# Patient Record
Sex: Female | Born: 1975
Health system: Southern US, Community
[De-identification: ages and names within clinical notes are randomized; demographics above are authoritative.]

## PROBLEM LIST (undated history)

## (undated) DIAGNOSIS — R51 Headache: Secondary | ICD-10-CM

## (undated) DIAGNOSIS — D649 Anemia, unspecified: Secondary | ICD-10-CM

## (undated) DIAGNOSIS — F32A Depression, unspecified: Secondary | ICD-10-CM

## (undated) DIAGNOSIS — F329 Major depressive disorder, single episode, unspecified: Secondary | ICD-10-CM

## (undated) DIAGNOSIS — G43909 Migraine, unspecified, not intractable, without status migrainosus: Secondary | ICD-10-CM

## (undated) HISTORY — DX: Major depressive disorder, single episode, unspecified: F32.9

## (undated) HISTORY — DX: Anemia, unspecified: D64.9

## (undated) HISTORY — DX: Headache: R51

## (undated) HISTORY — DX: Depression, unspecified: F32.A

---

## 2000-05-10 HISTORY — PX: VAGINAL HYSTERECTOMY: SUR661

## 2006-07-05 ENCOUNTER — Emergency Department (HOSPITAL_COMMUNITY): Admission: EM | Admit: 2006-07-05 | Discharge: 2006-07-05 | Payer: Self-pay | Admitting: Family Medicine

## 2006-07-26 ENCOUNTER — Emergency Department (HOSPITAL_COMMUNITY): Admission: EM | Admit: 2006-07-26 | Discharge: 2006-07-26 | Payer: Self-pay | Admitting: Family Medicine

## 2007-01-10 ENCOUNTER — Emergency Department (HOSPITAL_COMMUNITY): Admission: EM | Admit: 2007-01-10 | Discharge: 2007-01-10 | Payer: Self-pay | Admitting: Emergency Medicine

## 2007-05-05 ENCOUNTER — Emergency Department (HOSPITAL_COMMUNITY): Admission: EM | Admit: 2007-05-05 | Discharge: 2007-05-05 | Payer: Self-pay | Admitting: Emergency Medicine

## 2007-05-19 ENCOUNTER — Emergency Department (HOSPITAL_COMMUNITY): Admission: EM | Admit: 2007-05-19 | Discharge: 2007-05-19 | Payer: Self-pay | Admitting: Emergency Medicine

## 2008-02-18 ENCOUNTER — Emergency Department (HOSPITAL_COMMUNITY): Admission: EM | Admit: 2008-02-18 | Discharge: 2008-02-18 | Payer: Self-pay | Admitting: Family Medicine

## 2008-02-21 LAB — CONVERTED CEMR LAB

## 2008-05-16 ENCOUNTER — Emergency Department (HOSPITAL_COMMUNITY): Admission: EM | Admit: 2008-05-16 | Discharge: 2008-05-16 | Payer: Self-pay | Admitting: Emergency Medicine

## 2008-10-10 ENCOUNTER — Emergency Department (HOSPITAL_COMMUNITY): Admission: EM | Admit: 2008-10-10 | Discharge: 2008-10-10 | Payer: Self-pay | Admitting: Emergency Medicine

## 2009-02-17 ENCOUNTER — Ambulatory Visit: Payer: Self-pay | Admitting: Family Medicine

## 2009-02-17 DIAGNOSIS — Z862 Personal history of diseases of the blood and blood-forming organs and certain disorders involving the immune mechanism: Secondary | ICD-10-CM | POA: Insufficient documentation

## 2009-02-17 DIAGNOSIS — D649 Anemia, unspecified: Secondary | ICD-10-CM | POA: Insufficient documentation

## 2009-02-17 DIAGNOSIS — R519 Headache, unspecified: Secondary | ICD-10-CM | POA: Insufficient documentation

## 2009-02-17 DIAGNOSIS — F070 Personality change due to known physiological condition: Secondary | ICD-10-CM | POA: Insufficient documentation

## 2009-02-17 DIAGNOSIS — R51 Headache: Secondary | ICD-10-CM | POA: Insufficient documentation

## 2009-02-17 DIAGNOSIS — F418 Other specified anxiety disorders: Secondary | ICD-10-CM | POA: Insufficient documentation

## 2009-02-19 LAB — CONVERTED CEMR LAB
BUN: 15 mg/dL (ref 6–23)
Basophils Absolute: 0 10*3/uL (ref 0.0–0.1)
Basophils Relative: 0.2 % (ref 0.0–3.0)
CO2: 26 meq/L (ref 19–32)
Calcium: 9.9 mg/dL (ref 8.4–10.5)
Chloride: 104 meq/L (ref 96–112)
Cholesterol: 135 mg/dL (ref 0–200)
Creatinine, Ser: 0.9 mg/dL (ref 0.4–1.2)
Eosinophils Absolute: 0.1 10*3/uL (ref 0.0–0.7)
Eosinophils Relative: 0.9 % (ref 0.0–5.0)
GFR calc non Af Amer: 75.63 mL/min (ref >60)
Glucose, Bld: 77 mg/dL (ref 70–99)
HCT: 41.7 % (ref 36.0–46.0)
HDL: 44.9 mg/dL (ref >39.00)
Hemoglobin: 14.2 g/dL (ref 12.0–15.0)
LDL Cholesterol: 72 mg/dL (ref 0–99)
Lymphocytes Relative: 32.8 % (ref 12.0–46.0)
Lymphs Abs: 2.4 10*3/uL (ref 0.7–4.0)
MCHC: 34.1 g/dL (ref 30.0–36.0)
MCV: 89.5 fL (ref 78.0–100.0)
Monocytes Absolute: 0.3 10*3/uL (ref 0.1–1.0)
Monocytes Relative: 3.9 % (ref 3.0–12.0)
Neutro Abs: 4.6 10*3/uL (ref 1.4–7.7)
Neutrophils Relative %: 62.2 % (ref 43.0–77.0)
Platelets: 212 10*3/uL (ref 150.0–400.0)
Potassium: 4.6 meq/L (ref 3.5–5.1)
RBC: 4.66 M/uL (ref 3.87–5.11)
RDW: 11.8 % (ref 11.5–14.6)
Sodium: 141 meq/L (ref 135–145)
Total CHOL/HDL Ratio: 3
Triglycerides: 89 mg/dL (ref 0.0–149.0)
VLDL: 17.8 mg/dL (ref 0.0–40.0)
WBC: 7.4 10*3/uL (ref 4.5–10.5)

## 2009-03-20 ENCOUNTER — Ambulatory Visit: Payer: Self-pay | Admitting: Family Medicine

## 2009-04-20 ENCOUNTER — Emergency Department (HOSPITAL_COMMUNITY): Admission: EM | Admit: 2009-04-20 | Discharge: 2009-04-20 | Payer: Self-pay | Admitting: Family Medicine

## 2009-04-21 ENCOUNTER — Encounter: Payer: Self-pay | Admitting: Family Medicine

## 2009-04-21 ENCOUNTER — Ambulatory Visit: Payer: Self-pay | Admitting: Family Medicine

## 2009-04-21 ENCOUNTER — Other Ambulatory Visit: Admission: RE | Admit: 2009-04-21 | Discharge: 2009-04-21 | Payer: Self-pay | Admitting: Family Medicine

## 2009-04-23 ENCOUNTER — Encounter (INDEPENDENT_AMBULATORY_CARE_PROVIDER_SITE_OTHER): Payer: Self-pay | Admitting: *Deleted

## 2009-07-30 ENCOUNTER — Ambulatory Visit: Payer: Self-pay | Admitting: Family Medicine

## 2009-07-30 DIAGNOSIS — K5289 Other specified noninfective gastroenteritis and colitis: Secondary | ICD-10-CM | POA: Insufficient documentation

## 2010-05-17 ENCOUNTER — Emergency Department (HOSPITAL_COMMUNITY)
Admission: EM | Admit: 2010-05-17 | Discharge: 2010-05-17 | Payer: Self-pay | Source: Home / Self Care | Admitting: Emergency Medicine

## 2010-06-08 ENCOUNTER — Other Ambulatory Visit (HOSPITAL_COMMUNITY): Payer: Self-pay | Admitting: Sports Medicine

## 2010-06-08 DIAGNOSIS — T148XXA Other injury of unspecified body region, initial encounter: Secondary | ICD-10-CM

## 2010-06-09 NOTE — Assessment & Plan Note (Signed)
Summary: diarrhea, feeling tired/ alc 2:15   Vital Signs:  Patient profile:   35 year old female Height:      63 inches Weight:      128 pounds BMI:     22.76 Temp:     97.9 degrees F oral Pulse rate:   84 / minute Pulse rhythm:   regular BP sitting:   104 / 66  (left arm) Cuff size:   regular  Vitals Entered By: Delilah Shan CMA Duncan Dull) (July 30, 2009 2:00 PM) CC: diarrhea, vomiting   History of Present Illness: 35 yo with 2 day of vomiting diarrhea. Woke up two days ago vomiting, that has improved.  Last vomitted yesterday but still nauseated. Started getting diarrhea this morning, has had 4 or 5 loose BMs today. No blood in stool or blood in emesis. Has chills but no fevers. Able to keep down liquids, no solids. No abdominal pain.  Current Medications (verified): 1)  Ambien Cr 12.5 Mg Tbcr (Zolpidem Tartrate) .Marland Kitchen.. 1 By Mouth At Bedtime As Needed 2)  Promethazine Hcl 25 Mg  Tabs (Promethazine Hcl) .Marland Kitchen.. 1 Tab By Mouth Every 6 Hours As Needed For Nausea  Allergies: 1)  ! Penicillin  Review of Systems      See HPI General:  Complains of chills; denies fever. GI:  Complains of diarrhea, nausea, and vomiting; denies abdominal pain and bloody stools.  Physical Exam  General:  Well-developed,well-nourished,in no acute distress; alert,appropriate and cooperative throughout examination Mouth:  MMM Abdomen:  Bowel sounds positive,abdomen soft and non-tender without masses, organomegaly or hernias noted. Psych:  good eye contact, not anxious appearing.     Impression & Recommendations:  Problem # 1:  GASTROENTERITIS WITHOUT DEHYDRATION (ICD-558.9) Assessment New Likely virial.  Will send in prescription for phenergan. Advised pushing fluids. See pt instructions.  Complete Medication List: 1)  Ambien Cr 12.5 Mg Tbcr (Zolpidem tartrate) .Marland Kitchen.. 1 by mouth at bedtime as needed 2)  Promethazine Hcl 25 Mg Tabs (Promethazine hcl) .Marland Kitchen.. 1 tab by mouth every 6 hours as needed  for nausea  Patient Instructions: 1)  The main problem with gastroentereritis is dehydration. Drink plenty of fluids and take solids as you feel better. If you are unable to keep anything down and/or you show signs of dehydration( dry cracked lips, lack of tears, not urinating, very sleepy) , call our office.  Prescriptions: PROMETHAZINE HCL 25 MG  TABS (PROMETHAZINE HCL) 1 tab by mouth every 6 hours as needed for nausea  #30 x 0   Entered and Authorized by:   Ruthe Mannan MD   Signed by:   Ruthe Mannan MD on 07/30/2009   Method used:   Electronically to        [facility] (retail)       [address]       [place], [place]  [address]       Ph: [phone] or [phone]       Fax: [phone]   RxID:   [id number]   Current Allergies (reviewed today): ! PENICILLIN

## 2010-06-09 NOTE — Letter (Signed)
Summary: Out of Work  [employer] [employer] [facility]  [address] [place], [place] [address]   Phone: [phone]  Fax: [phone]    July 30, 2009   Employee:  CARLOYN LAHUE    To Whom It May Concern:   For Medical reasons, please excuse the above named employee from work for the following dates:  Start:   07/28/2009  End:   08/03/2009  If you need additional information, please feel free to contact our office.         Sincerely,    Ruthe Mannan MD

## 2010-06-13 ENCOUNTER — Encounter (HOSPITAL_COMMUNITY): Payer: Self-pay

## 2010-06-13 ENCOUNTER — Ambulatory Visit (HOSPITAL_COMMUNITY)
Admission: RE | Admit: 2010-06-13 | Discharge: 2010-06-13 | Disposition: A | Discharge status: Home / Self Care | Payer: Self-pay | Source: Ambulatory Visit | Attending: Sports Medicine | Admitting: Sports Medicine

## 2010-06-13 DIAGNOSIS — R609 Edema, unspecified: Secondary | ICD-10-CM | POA: Insufficient documentation

## 2010-06-13 DIAGNOSIS — T148XXA Other injury of unspecified body region, initial encounter: Secondary | ICD-10-CM

## 2010-06-13 DIAGNOSIS — M25569 Pain in unspecified knee: Secondary | ICD-10-CM | POA: Insufficient documentation

## 2010-08-07 ENCOUNTER — Encounter: Payer: Self-pay | Admitting: Family Medicine

## 2010-08-07 LAB — HM PAP SMEAR

## 2010-08-11 ENCOUNTER — Ambulatory Visit (INDEPENDENT_AMBULATORY_CARE_PROVIDER_SITE_OTHER): Payer: Self-pay | Admitting: Family Medicine

## 2010-08-11 ENCOUNTER — Encounter: Payer: Self-pay | Admitting: Family Medicine

## 2010-08-11 VITALS — BP 110/70 | HR 56 | Temp 98.3°F | Ht 61.5 in | Wt 136.0 lb

## 2010-08-11 DIAGNOSIS — F3289 Other specified depressive episodes: Secondary | ICD-10-CM

## 2010-08-11 DIAGNOSIS — F329 Major depressive disorder, single episode, unspecified: Secondary | ICD-10-CM

## 2010-08-11 MED ORDER — ESCITALOPRAM OXALATE 10 MG PO TABS: 10.0000 mg | ORAL_TABLET | Freq: Every day | ORAL | Status: DC

## 2010-08-11 NOTE — Progress Notes (Signed)
35 yo here for follow up depression.  Depression- diagnosed with depression 11 years ago.  Has been on multiple antidepressants in past, most recently Lexapro 10 mg daily in 04/2009.  Had been doing well with this dosage but lost her insurance and could no longer afford it.  In the past, Wellbutrin 150 mg daily had helped at one point but was ineffective when we tried it in 2010.       Last several months, she is more on edge, more tearful.  No SI or HI.  The PMH, PSH, Social History, Family History, Medications, and allergies have been reviewed in [facility], and have been updated if relevant.    Review of Systems       See HPI  Psych:  Denies anxiety,endorses depression, easily angered, and easily tearful.  Physical Exam BP 110/70  Pulse 56  Temp(Src) 98.3 F (36.8 C) (Oral)  Ht 5' 1.5" (1.562 m)  Wt 136 lb (61.689 kg)  BMI 25.28 kg/m2  General:  Well-developed,well-nourished,in no acute distress; alert,appropriate and cooperative throughout examination Head:  normocephalic and atraumatic.   Lungs:  Normal respiratory effort, chest expands symmetrically. Lungs are clear to auscultation, no crackles or wheezes. Heart:  Normal rate and regular rhythm. S1 and S2 normal without gallop, murmur, click, rub or other extra sounds. Psych:  Cognition and judgment appear intact. Alert and cooperative with normal attention span and concentration. No apparent delusions, illusions, hallucinations

## 2010-08-11 NOTE — Assessment & Plan Note (Addendum)
Deteriorated. >15 min spent with patient, at least half of which was spent on counseling Given samples of lexapro along with a written rx.

## 2010-08-17 LAB — POCT RAPID STREP A (OFFICE): Streptococcus, Group A Screen (Direct): NEGATIVE

## 2010-12-18 ENCOUNTER — Telehealth: Payer: Self-pay | Admitting: *Deleted

## 2010-12-18 NOTE — Telephone Encounter (Signed)
Pt needs form completed for patient assistance, also needs written 90 day script for lexapro to send along with it.  Form just needs your signature on 2nd page.  This is ok to wait until Monday.

## 2010-12-21 MED ORDER — ESCITALOPRAM OXALATE 10 MG PO TABS: 10.0000 mg | ORAL_TABLET | Freq: Every day | ORAL | Status: DC

## 2010-12-21 NOTE — Telephone Encounter (Signed)
Rx and form in my box.

## 2010-12-21 NOTE — Telephone Encounter (Signed)
Advised pt form and script are ready for pickup.

## 2011-06-09 ENCOUNTER — Telehealth: Payer: Self-pay | Admitting: Family Medicine

## 2011-06-09 NOTE — Telephone Encounter (Signed)
Yes ok with me

## 2011-06-09 NOTE — Telephone Encounter (Signed)
Patient is requesting an order for tb test.  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 She would like to schedule an appointment this Friday.

## 2011-06-10 NOTE — Telephone Encounter (Signed)
Spoke with patient and she stated that she cannot afford the $57.00 that we charge for the TB skin test.  She stated that she will go somewhere else to have it done.

## 2011-07-07 ENCOUNTER — Encounter: Payer: Self-pay | Admitting: Family Medicine

## 2011-07-07 ENCOUNTER — Ambulatory Visit (INDEPENDENT_AMBULATORY_CARE_PROVIDER_SITE_OTHER): Payer: Self-pay | Admitting: Family Medicine

## 2011-07-07 VITALS — BP 102/72 | HR 72 | Temp 98.1°F | Wt 143.0 lb

## 2011-07-07 DIAGNOSIS — J069 Acute upper respiratory infection, unspecified: Secondary | ICD-10-CM

## 2011-07-07 MED ORDER — AZITHROMYCIN 250 MG PO TABS: ORAL_TABLET | ORAL | Status: DC

## 2011-07-07 NOTE — Progress Notes (Signed)
SUBJECTIVE:  Erika Brock is a 36 y.o. female who complains of coryza, congestion, sneezing, sore throat, dry cough and bilateral sinus pain for 9 days. She denies a history of anorexia, chest pain, chills and fatigue and admits to a history of asthma. Patient denies smoke cigarettes.   Patient Active Problem List  Diagnoses  . ANEMIA-NOS  . POOR CONCENTRATION  . DEPRESSION  . GASTROENTERITIS WITHOUT DEHYDRATION  . HEADACHE  . ANEMIA, HX OF   Past Medical History  Diagnosis Date  . Anemia     NOS  . Depression   . Headache    Past Surgical History  Procedure Date  . Vaginal hysterectomy 2002    Prolapse, unsure if she still has ovaries   History  Substance Use Topics  . Smoking status: Never Smoker   . Smokeless tobacco: Not on file  . Alcohol Use: No   Family History  Problem Relation Age of Onset  . Diabetes Mother   . Bipolar disorder Mother   . Diabetes Father   . Diabetes Daughter   . ADD / ADHD Son    Allergies  Allergen Reactions  . Prozac (Fluoxetine Hcl) Swelling  . Penicillins     REACTION: Severe reaction.   Current Outpatient Prescriptions on File Prior to Visit  Medication Sig Dispense Refill  . escitalopram (LEXAPRO) 10 MG tablet Take 1 tablet (10 mg total) by mouth daily.  90 tablet  3  . Multiple Vitamin (MULTIVITAMIN) capsule Take 1 capsule by mouth daily.        . promethazine (PHENERGAN) 25 MG tablet Take 25 mg by mouth every 6 (six) hours as needed.        . zolpidem (AMBIEN CR) 12.5 MG CR tablet Take 12.5 mg by mouth at bedtime as needed.         The PMH, PSH, Social History, Family History, Medications, and allergies have been reviewed in [facility], and have been updated if relevant.  OBJECTIVE: BP 102/72  Pulse 72  Temp(Src) 98.1 F (36.7 C) (Oral)  Wt 143 lb (64.864 kg)  She appears well, vital signs are as noted. Ears normal.  Throat and pharynx normal.  Neck supple. No adenopathy in the neck. Nose is congested. Sinuses non tender.  The chest is clear, without wheezes or rales.  ASSESSMENT:  sinusitis  PLAN: Given duration and progression of symptoms, will treat for bacterial sinusitis with Zpack (PCN allergic). Symptomatic therapy suggested: push fluids, rest and return office visit prn if symptoms persist or worsen. Lack of antibiotic effectiveness discussed with her. Call or return to clinic prn if these symptoms worsen or fail to improve as anticipated.

## 2011-07-07 NOTE — Patient Instructions (Signed)
Take antibiotic as directed.  Drink lots of fluids.  Treat sympotmatically with Mucinex, nasal saline irrigation, and Tylenol/Ibuprofen. Also try claritin D or zyrtec D over the counter- two times a day as needed ( have to sign for them at pharmacy). You can use warm compresses.  Cough suppressant at night. Call if not improving as expected in 5-7 days.    

## 2011-10-14 ENCOUNTER — Encounter: Payer: Self-pay | Admitting: Family Medicine

## 2011-10-14 ENCOUNTER — Ambulatory Visit (INDEPENDENT_AMBULATORY_CARE_PROVIDER_SITE_OTHER): Payer: Self-pay | Admitting: Family Medicine

## 2011-10-14 VITALS — BP 110/70 | HR 72 | Temp 97.9°F | Wt 143.0 lb

## 2011-10-14 DIAGNOSIS — J069 Acute upper respiratory infection, unspecified: Secondary | ICD-10-CM

## 2011-10-14 MED ORDER — AZITHROMYCIN 250 MG PO TABS: ORAL_TABLET | ORAL | Status: AC

## 2011-10-14 MED ORDER — HYDROCOD POLST-CHLORPHEN POLST 10-8 MG/5ML PO LQCR: 5.0000 mL | Freq: Every evening | ORAL | Status: DC | PRN

## 2011-10-14 NOTE — Patient Instructions (Signed)
Take Zpack as directed.  Drink lots of fluids.  Treat sympotmatically with Mucinex, nasal saline irrigation, and Tylenol/Ibuprofen. Also try claritin D or zyrtec D over the counter- two times a day as needed ( have to sign for them at pharmacy). You can use warm compresses.  Cough suppressant at night. Call if not improving as expected in 5-7 days.    

## 2011-10-14 NOTE — Progress Notes (Signed)
SUBJECTIVE:  Erika Brock is a 36 y.o. female who complains of coryza, congestion, sore throat, post nasal drip, productive cough and bilateral sinus pain for 12 days. She denies a history of anorexia and chest pain and admits to a history of asthma. Patient denies smoke cigarettes.   Patient Active Problem List  Diagnoses  . ANEMIA-NOS  . POOR CONCENTRATION  . DEPRESSION  . GASTROENTERITIS WITHOUT DEHYDRATION  . HEADACHE  . ANEMIA, HX OF   Past Medical History  Diagnosis Date  . Anemia     NOS  . Depression   . Headache    Past Surgical History  Procedure Date  . Vaginal hysterectomy 2002    Prolapse, unsure if she still has ovaries   History  Substance Use Topics  . Smoking status: Never Smoker   . Smokeless tobacco: Not on file  . Alcohol Use: No   Family History  Problem Relation Age of Onset  . Diabetes Mother   . Bipolar disorder Mother   . Diabetes Father   . Diabetes Daughter   . ADD / ADHD Son    Allergies  Allergen Reactions  . Prozac (Fluoxetine Hcl) Swelling  . Penicillins     REACTION: Severe reaction.   Current Outpatient Prescriptions on File Prior to Visit  Medication Sig Dispense Refill  . Multiple Vitamin (MULTIVITAMIN) capsule Take 1 capsule by mouth daily.        . promethazine (PHENERGAN) 25 MG tablet Take 25 mg by mouth every 6 (six) hours as needed.        . zolpidem (AMBIEN CR) 12.5 MG CR tablet Take 12.5 mg by mouth at bedtime as needed.         The PMH, PSH, Social History, Family History, Medications, and allergies have been reviewed in [facility], and have been updated if relevant.  OBJECTIVE: BP 110/70  Pulse 72  Temp(Src) 97.9 F (36.6 C) (Oral)  Wt 143 lb (64.864 kg)  She appears well, vital signs are as noted. Ears normal.  Throat and pharynx normal.  Neck supple. No adenopathy in the neck. Nose is congested. Sinuses  tender. The chest is clear, without wheezes or rales.  ASSESSMENT:  sinusitis  PLAN: Given duration and  progression of symptoms, will treat for bacterial sinusitis with Zpack (PCN allergic). Symptomatic therapy suggested: push fluids, rest and return office visit prn if symptoms persist or worsen. Call or return to clinic prn if these symptoms worsen or fail to improve as anticipated.

## 2012-02-15 ENCOUNTER — Encounter: Payer: Self-pay | Admitting: Family Medicine

## 2012-02-15 ENCOUNTER — Ambulatory Visit (INDEPENDENT_AMBULATORY_CARE_PROVIDER_SITE_OTHER): Payer: Self-pay | Admitting: Family Medicine

## 2012-02-15 VITALS — BP 100/80 | HR 76 | Temp 98.0°F | Wt 125.0 lb

## 2012-02-15 DIAGNOSIS — K529 Noninfective gastroenteritis and colitis, unspecified: Secondary | ICD-10-CM

## 2012-02-15 DIAGNOSIS — K5289 Other specified noninfective gastroenteritis and colitis: Secondary | ICD-10-CM

## 2012-02-15 MED ORDER — KETOROLAC TROMETHAMINE 60 MG/2ML IM SOLN
60.0000 mg | Freq: Once | INTRAMUSCULAR | Status: AC
  Administered 2012-02-15: 60 mg via INTRAMUSCULAR

## 2012-02-15 NOTE — Patient Instructions (Addendum)
Please take phenergan as directed. Please call me if you are out of phenergan. Drink fluids- try to eat foods gentle on the stomach like bananas, toast.

## 2012-02-15 NOTE — Progress Notes (Signed)
(  S) Erika Brock is a 36 y.o. female with complaint of gastrointestinal symptoms of watery diarrhea, nausea, vomiting for 3 days. No blood in stool. She has not eaten anything since she vomited yesterday and this morning she felt shaky and dizzy.  Also woke up with a runny nose and slight nasal congestion this morning.  She does have chills, no fever.  Son has similar symptoms.  No sore throat.  She has phenergan at home- has not tried it yet. She is getting a headache this morning and worried that she will get a migraine.   Patient Active Problem List  Diagnosis  . ANEMIA-NOS  . POOR CONCENTRATION  . DEPRESSION  . GASTROENTERITIS WITHOUT DEHYDRATION  . HEADACHE  . ANEMIA, HX OF   Past Medical History  Diagnosis Date  . Anemia     NOS  . Depression   . Headache    Past Surgical History  Procedure Date  . Vaginal hysterectomy 2002    Prolapse, unsure if she still has ovaries   History  Substance Use Topics  . Smoking status: Current Every Day Smoker  . Smokeless tobacco: Not on file  . Alcohol Use: No   Family History  Problem Relation Age of Onset  . Diabetes Mother   . Bipolar disorder Mother   . Diabetes Father   . Diabetes Daughter   . ADD / ADHD Son    Allergies  Allergen Reactions  . Prozac (Fluoxetine Hcl) Swelling  . Penicillins     REACTION: Severe reaction.   Current Outpatient Prescriptions on File Prior to Visit  Medication Sig Dispense Refill  . calcium gluconate 500 MG tablet Take 500 mg by mouth daily.      . Multiple Vitamin (MULTIVITAMIN) capsule Take 1 capsule by mouth daily.         The PMH, PSH, Social History, Family History, Medications, and allergies have been reviewed in [facility], and have been updated if relevant.  (O)  BP 100/80  Pulse 76  Temp 98 F (36.7 C)  Wt 125 lb (56.7 kg)  Physical exam reveals the patient appears well. Hydration status: well hydrated. Abdomen: abdomen is soft without significant tenderness, masses,  organomegaly or guarding..  (A) Viral Gastroenteritis  (P) I have recommended small amounts clear fluids frequently, soups, juices, water and advance diet as tolerated. Return office visit if symptoms persist or worsen; I have alerted the patient to call if high fever, dehydration, marked weakness, fainting, increased abdominal pain, blood in stool or vomit. Also given IM toradol in office for headache.

## 2012-02-17 ENCOUNTER — Telehealth: Payer: Self-pay | Admitting: *Deleted

## 2012-02-17 MED ORDER — AZITHROMYCIN 250 MG PO TABS: ORAL_TABLET | ORAL | Status: DC

## 2012-02-17 MED ORDER — ALBUTEROL SULFATE HFA 108 (90 BASE) MCG/ACT IN AERS: 2.0000 | INHALATION_SPRAY | Freq: Four times a day (QID) | RESPIRATORY_TRACT | Status: DC | PRN

## 2012-02-17 NOTE — Telephone Encounter (Signed)
Rx sent 

## 2012-02-17 NOTE — Telephone Encounter (Signed)
Call from patient.  She says she is worse since her visit on 10/8.  States her lungs hurt when she breathes, she is coughing up yellow mucous, feels hot.  Requests [facility] to [facility].   Please advise.

## 2012-02-21 ENCOUNTER — Telehealth: Payer: Self-pay | Admitting: *Deleted

## 2012-02-21 ENCOUNTER — Encounter: Payer: Self-pay | Admitting: *Deleted

## 2012-02-21 NOTE — Telephone Encounter (Signed)
Message copied by Eliezer Bottom on Mon Feb 21, 2012 11:37 AM ------      Message from: Dedra Skeens      Created: Mon Feb 21, 2012 10:58 AM       Patient came in the office stating that Dr. Dayton Martes wrote her a "return to work" note. I could not find the note anywhere upfront. In her chart there has been a return to work letter but the letter has been deleted.             Can this patient have a return to work note? Best # [phone] to reach patient to let her know when note is ready.

## 2012-02-21 NOTE — Telephone Encounter (Signed)
Note given to patient.

## 2012-03-25 ENCOUNTER — Emergency Department (HOSPITAL_COMMUNITY)
Admission: EM | Admit: 2012-03-25 | Discharge: 2012-03-25 | Disposition: A | Discharge status: Home / Self Care | Payer: Self-pay | Attending: Emergency Medicine | Admitting: Emergency Medicine

## 2012-03-25 ENCOUNTER — Emergency Department (HOSPITAL_COMMUNITY): Payer: Self-pay

## 2012-03-25 ENCOUNTER — Encounter (HOSPITAL_COMMUNITY): Payer: Self-pay | Admitting: Physical Medicine and Rehabilitation

## 2012-03-25 DIAGNOSIS — F329 Major depressive disorder, single episode, unspecified: Secondary | ICD-10-CM | POA: Insufficient documentation

## 2012-03-25 DIAGNOSIS — R51 Headache: Secondary | ICD-10-CM | POA: Insufficient documentation

## 2012-03-25 DIAGNOSIS — R1013 Epigastric pain: Secondary | ICD-10-CM

## 2012-03-25 DIAGNOSIS — Z79899 Other long term (current) drug therapy: Secondary | ICD-10-CM | POA: Insufficient documentation

## 2012-03-25 DIAGNOSIS — K219 Gastro-esophageal reflux disease without esophagitis: Secondary | ICD-10-CM | POA: Insufficient documentation

## 2012-03-25 DIAGNOSIS — F3289 Other specified depressive episodes: Secondary | ICD-10-CM | POA: Insufficient documentation

## 2012-03-25 DIAGNOSIS — R110 Nausea: Secondary | ICD-10-CM | POA: Insufficient documentation

## 2012-03-25 DIAGNOSIS — F17200 Nicotine dependence, unspecified, uncomplicated: Secondary | ICD-10-CM | POA: Insufficient documentation

## 2012-03-25 DIAGNOSIS — D649 Anemia, unspecified: Secondary | ICD-10-CM | POA: Insufficient documentation

## 2012-03-25 LAB — URINALYSIS, ROUTINE W REFLEX MICROSCOPIC
Bilirubin Urine: NEGATIVE
Glucose, UA: NEGATIVE mg/dL
Hgb urine dipstick: NEGATIVE
Ketones, ur: NEGATIVE mg/dL
Leukocytes, UA: NEGATIVE
Nitrite: NEGATIVE
Protein, ur: NEGATIVE mg/dL
Specific Gravity, Urine: 1.008 (ref 1.005–1.030)
Urobilinogen, UA: 0.2 mg/dL (ref 0.0–1.0)
pH: 6.5 (ref 5.0–8.0)

## 2012-03-25 LAB — CBC WITH DIFFERENTIAL/PLATELET
Basophils Absolute: 0 10*3/uL (ref 0.0–0.1)
Basophils Relative: 1 % (ref 0–1)
Eosinophils Absolute: 0.1 10*3/uL (ref 0.0–0.7)
Eosinophils Relative: 1 % (ref 0–5)
HCT: 41.5 % (ref 36.0–46.0)
Hemoglobin: 14.8 g/dL (ref 12.0–15.0)
Lymphocytes Relative: 30 % (ref 12–46)
Lymphs Abs: 2.7 10*3/uL (ref 0.7–4.0)
MCH: 30.5 pg (ref 26.0–34.0)
MCHC: 35.7 g/dL (ref 30.0–36.0)
MCV: 85.4 fL (ref 78.0–100.0)
Monocytes Absolute: 0.5 10*3/uL (ref 0.1–1.0)
Monocytes Relative: 5 % (ref 3–12)
Neutro Abs: 5.6 10*3/uL (ref 1.7–7.7)
Neutrophils Relative %: 63 % (ref 43–77)
Platelets: 247 10*3/uL (ref 150–400)
RBC: 4.86 MIL/uL (ref 3.87–5.11)
RDW: 12.7 % (ref 11.5–15.5)
WBC: 8.9 10*3/uL (ref 4.0–10.5)

## 2012-03-25 LAB — COMPREHENSIVE METABOLIC PANEL
ALT: 14 U/L (ref 0–35)
AST: 18 U/L (ref 0–37)
Albumin: 4.3 g/dL (ref 3.5–5.2)
Alkaline Phosphatase: 66 U/L (ref 39–117)
BUN: 13 mg/dL (ref 6–23)
CO2: 24 mEq/L (ref 19–32)
Calcium: 9.8 mg/dL (ref 8.4–10.5)
Chloride: 105 mEq/L (ref 96–112)
Creatinine, Ser: 0.8 mg/dL (ref 0.50–1.10)
GFR calc Af Amer: >90 mL/min (ref >90)
GFR calc non Af Amer: >90 mL/min (ref >90)
Glucose, Bld: 89 mg/dL (ref 70–99)
Potassium: 3.6 mEq/L (ref 3.5–5.1)
Sodium: 140 mEq/L (ref 135–145)
Total Bilirubin: 0.2 mg/dL — ABNORMAL LOW (ref 0.3–1.2)
Total Protein: 7.7 g/dL (ref 6.0–8.3)

## 2012-03-25 LAB — POCT PREGNANCY, URINE: Preg Test, Ur: NEGATIVE

## 2012-03-25 LAB — LIPASE, BLOOD: Lipase: 40 U/L (ref 11–59)

## 2012-03-25 MED ORDER — FAMOTIDINE 20 MG PO TABS
20.0000 mg | ORAL_TABLET | Freq: Once | ORAL | Status: AC
Start: 2012-03-25 — End: 2012-03-25
  Administered 2012-03-25: 20 mg via ORAL
  Filled 2012-03-25: qty 1

## 2012-03-25 MED ORDER — GI COCKTAIL ~~LOC~~
30.0000 mL | Freq: Once | ORAL | Status: AC
  Administered 2012-03-25: 30 mL via ORAL
  Filled 2012-03-25: qty 30

## 2012-03-25 MED ORDER — OMEPRAZOLE 20 MG PO CPDR: 20.0000 mg | DELAYED_RELEASE_CAPSULE | Freq: Every day | ORAL | Status: DC

## 2012-03-25 MED ORDER — SUCRALFATE 1 G PO TABS: 1.0000 g | ORAL_TABLET | Freq: Four times a day (QID) | ORAL | Status: DC

## 2012-03-25 MED ORDER — PANTOPRAZOLE SODIUM 40 MG PO TBEC
40.0000 mg | DELAYED_RELEASE_TABLET | Freq: Once | ORAL | Status: AC
  Administered 2012-03-25: 40 mg via ORAL
  Filled 2012-03-25: qty 1

## 2012-03-25 NOTE — ED Notes (Signed)
Patient transported to X-ray 

## 2012-03-25 NOTE — ED Notes (Signed)
Back from xray

## 2012-03-25 NOTE — ED Provider Notes (Signed)
History     CSN: [id number]  Arrival date & time 03/25/12  1322   First MD Initiated Contact with Patient 03/25/12 1716      Chief Complaint  Patient presents with  . Abdominal Pain    (Consider location/radiation/quality/duration/timing/severity/associated sxs/prior treatment) Patient is a 36 y.o. female presenting with abdominal pain. The history is provided by the patient.  Abdominal Pain The primary symptoms of the illness include abdominal pain and nausea. The primary symptoms of the illness do not include fever, shortness of breath, vomiting, diarrhea, dysuria, vaginal discharge or vaginal bleeding.  Symptoms associated with the illness do not include chills.  Pt reports symptoms have been going on and off for "a while" but worsened in the last 3 days. Reports pain comes and goes. Not associated with eating, but is worse with laying down.  States taking pepto bismal with no relief. Denies vomiting. No urinary symptoms. No vaginal discharge or bleeding. No diarrhea. Normal bowel movements daily. Denies black stools. No hx of the same. Pt denies drinking alcohol. Admits to smoking and lots of caffeine beverages.   Past Medical History  Diagnosis Date  . Anemia     NOS  . Depression   . Headache     Past Surgical History  Procedure Date  . Vaginal hysterectomy 2002    Prolapse, unsure if she still has ovaries    Family History  Problem Relation Age of Onset  . Diabetes Mother   . Bipolar disorder Mother   . Diabetes Father   . Diabetes Daughter   . ADD / ADHD Son     History  Substance Use Topics  . Smoking status: Current Every Day Smoker    Types: Cigarettes  . Smokeless tobacco: Not on file  . Alcohol Use: No    OB History    Grav Para Term Preterm Abortions TAB SAB Ect Mult Living                  Review of Systems  Constitutional: Negative for fever and chills.  Respiratory: Negative for chest tightness and shortness of breath.   Gastrointestinal:  Positive for nausea and abdominal pain. Negative for vomiting and diarrhea.  Genitourinary: Negative for dysuria, vaginal bleeding and vaginal discharge.  All other systems reviewed and are negative.    Allergies  Prozac and Penicillins  Home Medications   Current Outpatient Rx  Name  Route  Sig  Dispense  Refill  . ALBUTEROL SULFATE HFA 108 (90 BASE) MCG/ACT IN AERS   Inhalation   Inhale 2 puffs into the lungs every 6 (six) hours as needed. FOR SHORTNESS OF BREATH         . CALCIUM GLUCONATE 500 MG PO TABS   Oral   Take 500 mg by mouth daily.         Marland Kitchen COENZYME Q10 30 MG PO CAPS   Oral   Take 30 mg by mouth daily.         Marland Kitchen VITAMIN B-12 PO   Oral   Take 1 tablet by mouth daily.         . MULTIVITAMINS PO CAPS   Oral   Take 1 capsule by mouth daily.          Marland Kitchen FISH OIL 500 MG PO CAPS   Oral   Take 500 mg by mouth daily.            BP 112/81  Pulse 79  Temp 98.7 F (37.1  C) (Oral)  Resp 18  SpO2 98%  Physical Exam  Nursing note and vitals reviewed. Constitutional: She appears well-developed and well-nourished. No distress.  Neck: Neck supple.  Cardiovascular: Normal rate, regular rhythm and normal heart sounds.   Pulmonary/Chest: Effort normal and breath sounds normal. No respiratory distress. She has no wheezes. She has no rales.  Abdominal: Soft. Bowel sounds are normal. She exhibits no distension. There is tenderness. There is no rebound and no guarding.       Epigastric tenderness. No CVA tenderness  Musculoskeletal: She exhibits no edema.  Neurological: She is alert.  Skin: Skin is warm and dry.  Psychiatric: She has a normal mood and affect.    ED Course  Procedures (including critical care time)  Pt with epigastric pain. No guarding. No acute abdomen. No black tarry stools. Will get labs. UA. Acute abd series.   Results for orders placed during the hospital encounter of 03/25/12  CBC WITH DIFFERENTIAL      Component Value Range    WBC 8.9  4.0 - 10.5 K/uL   RBC 4.86  3.87 - 5.11 MIL/uL   Hemoglobin 14.8  12.0 - 15.0 g/dL   HCT 40.9  81.1 - 91.4 %   MCV 85.4  78.0 - 100.0 fL   MCH 30.5  26.0 - 34.0 pg   MCHC 35.7  30.0 - 36.0 g/dL   RDW 78.2  95.6 - 21.3 %   Platelets 247  150 - 400 K/uL   Neutrophils Relative 63  43 - 77 %   Neutro Abs 5.6  1.7 - 7.7 K/uL   Lymphocytes Relative 30  12 - 46 %   Lymphs Abs 2.7  0.7 - 4.0 K/uL   Monocytes Relative 5  3 - 12 %   Monocytes Absolute 0.5  0.1 - 1.0 K/uL   Eosinophils Relative 1  0 - 5 %   Eosinophils Absolute 0.1  0.0 - 0.7 K/uL   Basophils Relative 1  0 - 1 %   Basophils Absolute 0.0  0.0 - 0.1 K/uL  COMPREHENSIVE METABOLIC PANEL      Component Value Range   Sodium 140  135 - 145 mEq/L   Potassium 3.6  3.5 - 5.1 mEq/L   Chloride 105  96 - 112 mEq/L   CO2 24  19 - 32 mEq/L   Glucose, Bld 89  70 - 99 mg/dL   BUN 13  6 - 23 mg/dL   Creatinine, Ser 0.86  0.50 - 1.10 mg/dL   Calcium 9.8  8.4 - 57.8 mg/dL   Total Protein 7.7  6.0 - 8.3 g/dL   Albumin 4.3  3.5 - 5.2 g/dL   AST 18  0 - 37 U/L   ALT 14  0 - 35 U/L   Alkaline Phosphatase 66  39 - 117 U/L   Total Bilirubin 0.2 (*) 0.3 - 1.2 mg/dL   GFR calc non Af Amer >90  >90 mL/min   GFR calc Af Amer >90  >90 mL/min  LIPASE, BLOOD      Component Value Range   Lipase 40  11 - 59 U/L  URINALYSIS, ROUTINE W REFLEX MICROSCOPIC      Component Value Range   Color, Urine YELLOW  YELLOW   APPearance CLEAR  CLEAR   Specific Gravity, Urine 1.008  1.005 - 1.030   pH 6.5  5.0 - 8.0   Glucose, UA NEGATIVE  NEGATIVE mg/dL   Hgb urine dipstick NEGATIVE  NEGATIVE   Bilirubin Urine NEGATIVE  NEGATIVE   Ketones, ur NEGATIVE  NEGATIVE mg/dL   Protein, ur NEGATIVE  NEGATIVE mg/dL   Urobilinogen, UA 0.2  0.0 - 1.0 mg/dL   Nitrite NEGATIVE  NEGATIVE   Leukocytes, UA NEGATIVE  NEGATIVE  POCT PREGNANCY, URINE      Component Value Range   Preg Test, Ur NEGATIVE  NEGATIVE   Dg Abd Acute W/chest  03/25/2012  *RADIOLOGY  REPORT*  Clinical Data: Abdominal pain.  ACUTE ABDOMEN SERIES (ABDOMEN 2 VIEW & CHEST 1 VIEW)  Comparison: Chest x-ray 04/20/2009.  Findings: Lung volumes are normal.  No consolidative airspace disease.  No pleural effusions.  No pneumothorax.  No pulmonary nodule or mass noted.  Pulmonary vasculature and the cardiomediastinal silhouette are within normal limits.  Gas and stool is seen scattered throughout the colon extending to the level of the distal rectum.  There are multiple prominent gas- filled but nondilated loops of small bowel noted throughout the central abdomen.  No definite pathologically dilated loops of small bowel are identified.  No gross evidence of pneumoperitoneum.  IMPRESSION: 1.  Nonspecific, nonobstructive bowel gas pattern, as above. 2.  No pneumoperitoneum. 3.  No radiographic evidence of acute cardiopulmonary disease.   Original Report Authenticated By: Trudie Reed, M.D.      1. Epigastric pain   2. GERD (gastroesophageal reflux disease)       MDM  Pt with epigastric pain. No acute abdomen on exam. Labs and x-ray, ua all normal. Pain completely relieved with gi cocktail, pepcid, protonix PO in ED. Pt afebrile, VS normal, no n/v. Suspect gastritis/gerd vs peptic ulcer disease. Will start on carafate. PPI. Follow up.         Lottie Mussel, PA 03/25/12 1918

## 2012-03-25 NOTE — ED Provider Notes (Signed)
Medical screening examination/treatment/procedure(s) were performed by non-physician practitioner and as supervising physician I was immediately available for consultation/collaboration.   Diar Berkel, MD 03/25/12 2251 

## 2012-03-25 NOTE — ED Notes (Signed)
Pt presents to department for evaluation of epigastric pain radiating to back. Ongoing x3 days. 3/10 pain, increases with sitting. Also states nausea. Denies fever. Denies urinary symptoms. Pt is conscious alert and oriented x4. No signs of acute distress noted.

## 2012-03-27 ENCOUNTER — Telehealth: Payer: Self-pay | Admitting: Family Medicine

## 2012-03-27 NOTE — Telephone Encounter (Signed)
[facility] Triage Call Report Triage Record Num: [id number] Operator: Griselda Miner Patient Name: Erika Brock Call Date & Time: 03/25/2012 12:27:50PM Patient Phone: [phone] PCP: Ruthe Mannan Patient Gender: Female PCP Fax : [phone] Patient DOB: 29-Apr-1976 Practice Name: [facility] Reason for Call: Caller: Adaysha/Patient; PCP: Ruthe Mannan (Family Practice); CB#: [phone]; Call regarding Abdominal pain below sternum but radiating up the ribs; Onset about 3 days 03/19/12 and describes pain finger below sternum starts as pain, the burns, then radiates following the ribcage, going into sides and back. When it happens it brings tears to her eye and she has to stop what she does. Has to walk bent over. Does not have a pattern but she it happens it can last about 30 minutes and slowly goes away. Last BM this morning and normal; Is able to swallow food/water she feels like it hits a brick in stomach. Has some nausea but no vomiting. There is some hardness over stomach area under sternum; LMP-Hysterectomy 10 years ago. Triaged using Abdominal Pain with a disposition to go to ED due to unbearable abdominal pain. Care advice given with patient demonstrating understanding. Husband will take to ED. Protocol(s) Used: Abdominal Pain Recommended Outcome per Protocol: See ED Immediately Reason for Outcome: Unbearable abdominal/pelvic pain Care Advice: ~ Allow the patient to be in a position of comfort. ~ Another adult should drive. ~ Pain medication or laxatives should not be taken until symptoms are evaluated. ~ Do not eat or drink anything until evaluated by provider. Call EMS 911 if signs and symptoms of shock develop (such as unable to stand due to faintness, dizziness, or lightheadedness; new onset of confusion; slow to respond or difficult to awaken; skin is pale, gray, cool, or moist to touch; severe weakness; loss of consciousness). ~ Write down provider's name.  List or place the following in a bag for transport with the patient: current prescription and/or nonprescription medications; alternative treatments, therapies and medications; and street drugs. ~ 03/25/2012 12:47:10PM Page 1 of 1 [id number]

## 2012-04-20 ENCOUNTER — Encounter: Payer: Self-pay | Admitting: Family Medicine

## 2012-04-20 ENCOUNTER — Ambulatory Visit (INDEPENDENT_AMBULATORY_CARE_PROVIDER_SITE_OTHER): Payer: Self-pay | Admitting: Family Medicine

## 2012-04-20 VITALS — BP 110/60 | HR 72 | Temp 97.9°F | Wt 123.0 lb

## 2012-04-20 DIAGNOSIS — J069 Acute upper respiratory infection, unspecified: Secondary | ICD-10-CM

## 2012-04-20 MED ORDER — AZITHROMYCIN 250 MG PO TABS: ORAL_TABLET | ORAL | Status: DC

## 2012-04-20 MED ORDER — ALBUTEROL SULFATE HFA 108 (90 BASE) MCG/ACT IN AERS: 2.0000 | INHALATION_SPRAY | Freq: Four times a day (QID) | RESPIRATORY_TRACT | Status: DC | PRN

## 2012-04-20 NOTE — Patient Instructions (Addendum)
Take Zpack as directed.  Drink lots of fluids.  Proair inhaler as needed for shortness of breath. Treat sympotmatically with Mucinex, nasal saline irrigation, and Tylenol/Ibuprofen.You can use warm compresses.  Cough suppressant at night. Call if not improving as expected in 5-7 days.

## 2012-04-20 NOTE — Telephone Encounter (Signed)
Pt did go to ER. 

## 2012-04-20 NOTE — Progress Notes (Signed)
SUBJECTIVE:  Erika Brock is a 36 y.o. female who complains of coryza, congestion, sneezing, sore throat and bilateral sinus pain for 8 days. She denies a history of anorexia and chest pain and denies a history of asthma. Patient does smoke cigarettes.   Patient Active Problem List  Diagnosis  . ANEMIA-NOS  . POOR CONCENTRATION  . DEPRESSION  . GASTROENTERITIS WITHOUT DEHYDRATION  . HEADACHE  . ANEMIA, HX OF  . Gastroenteritis   Past Medical History  Diagnosis Date  . Anemia     NOS  . Depression   . Headache    Past Surgical History  Procedure Date  . Vaginal hysterectomy 2002    Prolapse, unsure if she still has ovaries   History  Substance Use Topics  . Smoking status: Current Every Day Smoker    Types: Cigarettes  . Smokeless tobacco: Not on file  . Alcohol Use: No   Family History  Problem Relation Age of Onset  . Diabetes Mother   . Bipolar disorder Mother   . Diabetes Father   . Diabetes Daughter   . ADD / ADHD Son    Allergies  Allergen Reactions  . Prozac (Fluoxetine Hcl) Swelling  . Penicillins     REACTION: Severe reaction.   Current Outpatient Prescriptions on File Prior to Visit  Medication Sig Dispense Refill  . albuterol (PROVENTIL HFA;VENTOLIN HFA) 108 (90 BASE) MCG/ACT inhaler Inhale 2 puffs into the lungs every 6 (six) hours as needed. FOR SHORTNESS OF BREATH      . calcium gluconate 500 MG tablet Take 500 mg by mouth daily.      Marland Kitchen co-enzyme Q-10 30 MG capsule Take 30 mg by mouth daily.      . Cyanocobalamin (VITAMIN B-12 PO) Take 1 tablet by mouth daily.      . Multiple Vitamin (MULTIVITAMIN) capsule Take 1 capsule by mouth daily.       . Omega-3 Fatty Acids (FISH OIL) 500 MG CAPS Take 500 mg by mouth daily.       Marland Kitchen omeprazole (PRILOSEC) 20 MG capsule Take 1 capsule (20 mg total) by mouth daily.  30 capsule  1  . sucralfate (CARAFATE) 1 G tablet Take 1 tablet (1 g total) by mouth 4 (four) times daily.  60 tablet  0   The PMH, PSH,  Social History, Family History, Medications, and allergies have been reviewed in [facility], and have been updated if relevant.  OBJECTIVE: BP 110/60  Pulse 72  Temp 97.9 F (36.6 C)  Wt 123 lb (55.792 kg)  She appears well, vital signs are as noted. Ears normal.  Throat and pharynx normal.  Neck supple. No adenopathy in the neck. Nose is congested. Sinuses tender. Diffuse faint exp wheezes.  ASSESSMENT:  sinusitis and bronchitis  PLAN: Given duration and progression of symptoms, will treat for bacterial sinusitis/bronchitis. Symptomatic therapy suggested: push fluids, rest and return office visit prn if symptoms persist or worsen. Call or return to clinic prn if these symptoms worsen or fail to improve as anticipated.

## 2012-04-24 ENCOUNTER — Telehealth: Payer: Self-pay

## 2012-04-24 NOTE — Telephone Encounter (Signed)
May be viral and Zpack is still in her system.  Diarrhea could also be caused by the Zpack.  I will see her tomorrow but I would give it a few more days.

## 2012-04-24 NOTE — Telephone Encounter (Signed)
Pt was seen 04/20/12;still coughing up brown and blood tinged phlegm,S/T no better, ? Worse; head congestion no better and now diarrhea and dizziness. Pt will finish Zpak today. Pt already scheduled appt Dr Dayton Martes 04/25/12 [facility].Please advise. Pt did not want to see another dr.

## 2012-04-24 NOTE — Telephone Encounter (Signed)
Advised patient.  She will need a doctors note to be out of work for a few more days, so she will come in tomorrow.

## 2012-04-25 ENCOUNTER — Ambulatory Visit (INDEPENDENT_AMBULATORY_CARE_PROVIDER_SITE_OTHER): Payer: Self-pay | Admitting: Family Medicine

## 2012-04-25 ENCOUNTER — Telehealth: Payer: Self-pay | Admitting: *Deleted

## 2012-04-25 ENCOUNTER — Encounter: Payer: Self-pay | Admitting: Family Medicine

## 2012-04-25 VITALS — BP 110/70 | HR 84 | Temp 98.0°F | Wt 121.2 lb

## 2012-04-25 DIAGNOSIS — J069 Acute upper respiratory infection, unspecified: Secondary | ICD-10-CM

## 2012-04-25 DIAGNOSIS — R110 Nausea: Secondary | ICD-10-CM

## 2012-04-25 MED ORDER — DOXYCYCLINE HYCLATE 100 MG PO TABS: 100.0000 mg | ORAL_TABLET | Freq: Two times a day (BID) | ORAL | Status: DC

## 2012-04-25 MED ORDER — ONDANSETRON HCL 4 MG PO TABS: 4.0000 mg | ORAL_TABLET | Freq: Three times a day (TID) | ORAL | Status: DC | PRN

## 2012-04-25 MED ORDER — ONDANSETRON HCL 4 MG PO TABS
4.0000 mg | ORAL_TABLET | Freq: Once | ORAL | Status: AC
  Administered 2012-04-25: 4 mg via ORAL

## 2012-04-25 NOTE — Telephone Encounter (Signed)
Patient wants to know if she can have some zofran b/c it helped when he had it in the office

## 2012-04-25 NOTE — Telephone Encounter (Signed)
Yes, rx sent to [facility].  

## 2012-04-25 NOTE — Progress Notes (Signed)
SUBJECTIVE:  Erika Brock is a 36 y.o. female who complains of worsening coryza, congestion, sneezing, sore throat and bilateral sinus pain, now with post tussive emesis.  Finished last dose of zpack yesterday- PCN allergic. She denies a history of anorexia and chest pain and denies a history of asthma. Patient does smoke cigarettes.   Patient Active Problem List  Diagnosis  . ANEMIA-NOS  . POOR CONCENTRATION  . DEPRESSION  . GASTROENTERITIS WITHOUT DEHYDRATION  . HEADACHE  . ANEMIA, HX OF  . Gastroenteritis   Past Medical History  Diagnosis Date  . Anemia     NOS  . Depression   . Headache    Past Surgical History  Procedure Date  . Vaginal hysterectomy 2002    Prolapse, unsure if she still has ovaries   History  Substance Use Topics  . Smoking status: Current Every Day Smoker -- 0.5 packs/day for 16 years    Types: Cigarettes  . Smokeless tobacco: Never Used  . Alcohol Use: No   Family History  Problem Relation Age of Onset  . Diabetes Mother   . Bipolar disorder Mother   . Diabetes Father   . Diabetes Daughter   . ADD / ADHD Son    Allergies  Allergen Reactions  . Prozac (Fluoxetine Hcl) Swelling  . Penicillins     REACTION: Severe reaction.   Current Outpatient Prescriptions on File Prior to Visit  Medication Sig Dispense Refill  . albuterol (PROVENTIL HFA;VENTOLIN HFA) 108 (90 BASE) MCG/ACT inhaler Inhale 2 puffs into the lungs every 6 (six) hours as needed. FOR SHORTNESS OF BREATH  1 Inhaler  0  . calcium gluconate 500 MG tablet Take 500 mg by mouth daily.      Marland Kitchen co-enzyme Q-10 30 MG capsule Take 30 mg by mouth daily.      . Multiple Vitamin (MULTIVITAMIN) capsule Take 1 capsule by mouth daily.       . Omega-3 Fatty Acids (FISH OIL) 500 MG CAPS Take 500 mg by mouth daily.       Marland Kitchen omeprazole (PRILOSEC) 20 MG capsule Take 1 capsule (20 mg total) by mouth daily.  30 capsule  1  . sucralfate (CARAFATE) 1 G tablet Take 1 tablet (1 g total) by mouth 4 (four)  times daily.  60 tablet  0   The PMH, PSH, Social History, Family History, Medications, and allergies have been reviewed in [facility], and have been updated if relevant.  OBJECTIVE: BP 110/70  Pulse 84  Temp 98 F (36.7 C) (Oral)  Wt 121 lb 4 oz (54.999 kg)  SpO2 98%  She appears well, vital signs are as noted. Coughs and then vomits during exam .Ears normal.  Throat and pharynx normal.  Neck supple. No adenopathy in the neck. Nose is congested. Sinuses tender. Diffuse faint exp wheezes.  ASSESSMENT:  sinusitis and bronchitis  PLAN: ?macrolide resistance- start doxyxcycline 100 mg twice daily x 10 days, as needed zofran. Symptomatic therapy suggested: push fluids, rest and return office visit prn if symptoms persist or worsen. Call or return to clinic prn if these symptoms worsen or fail to improve as anticipated.

## 2012-05-12 ENCOUNTER — Encounter (HOSPITAL_COMMUNITY): Payer: Self-pay | Admitting: Emergency Medicine

## 2012-05-12 ENCOUNTER — Emergency Department (HOSPITAL_COMMUNITY)
Admission: EM | Admit: 2012-05-12 | Discharge: 2012-05-13 | Disposition: A | Discharge status: Home / Self Care | Payer: Self-pay | Attending: Emergency Medicine | Admitting: Emergency Medicine

## 2012-05-12 DIAGNOSIS — R112 Nausea with vomiting, unspecified: Secondary | ICD-10-CM | POA: Insufficient documentation

## 2012-05-12 DIAGNOSIS — R197 Diarrhea, unspecified: Secondary | ICD-10-CM | POA: Insufficient documentation

## 2012-05-12 DIAGNOSIS — Z79899 Other long term (current) drug therapy: Secondary | ICD-10-CM | POA: Insufficient documentation

## 2012-05-12 DIAGNOSIS — M549 Dorsalgia, unspecified: Secondary | ICD-10-CM | POA: Insufficient documentation

## 2012-05-12 DIAGNOSIS — F17200 Nicotine dependence, unspecified, uncomplicated: Secondary | ICD-10-CM | POA: Insufficient documentation

## 2012-05-12 DIAGNOSIS — Z862 Personal history of diseases of the blood and blood-forming organs and certain disorders involving the immune mechanism: Secondary | ICD-10-CM | POA: Insufficient documentation

## 2012-05-12 DIAGNOSIS — Z2089 Contact with and (suspected) exposure to other communicable diseases: Secondary | ICD-10-CM | POA: Insufficient documentation

## 2012-05-12 DIAGNOSIS — Z9889 Other specified postprocedural states: Secondary | ICD-10-CM | POA: Insufficient documentation

## 2012-05-12 DIAGNOSIS — Z8659 Personal history of other mental and behavioral disorders: Secondary | ICD-10-CM | POA: Insufficient documentation

## 2012-05-12 DIAGNOSIS — R1013 Epigastric pain: Secondary | ICD-10-CM | POA: Insufficient documentation

## 2012-05-12 DIAGNOSIS — Z8679 Personal history of other diseases of the circulatory system: Secondary | ICD-10-CM | POA: Insufficient documentation

## 2012-05-12 LAB — CBC WITH DIFFERENTIAL/PLATELET
Basophils Absolute: 0 10*3/uL (ref 0.0–0.1)
Basophils Relative: 0 % (ref 0–1)
Eosinophils Absolute: 0.2 10*3/uL (ref 0.0–0.7)
Eosinophils Relative: 2 % (ref 0–5)
HCT: 39.6 % (ref 36.0–46.0)
Hemoglobin: 13.8 g/dL (ref 12.0–15.0)
Lymphocytes Relative: 39 % (ref 12–46)
Lymphs Abs: 3.2 10*3/uL (ref 0.7–4.0)
MCH: 30.1 pg (ref 26.0–34.0)
MCHC: 34.8 g/dL (ref 30.0–36.0)
MCV: 86.5 fL (ref 78.0–100.0)
Monocytes Absolute: 0.6 10*3/uL (ref 0.1–1.0)
Monocytes Relative: 7 % (ref 3–12)
Neutro Abs: 4.3 10*3/uL (ref 1.7–7.7)
Neutrophils Relative %: 52 % (ref 43–77)
Platelets: 218 10*3/uL (ref 150–400)
RBC: 4.58 MIL/uL (ref 3.87–5.11)
RDW: 12.8 % (ref 11.5–15.5)
WBC: 8.2 10*3/uL (ref 4.0–10.5)

## 2012-05-12 LAB — COMPREHENSIVE METABOLIC PANEL
ALT: 14 U/L (ref 0–35)
AST: 17 U/L (ref 0–37)
Albumin: 4 g/dL (ref 3.5–5.2)
Alkaline Phosphatase: 70 U/L (ref 39–117)
BUN: 13 mg/dL (ref 6–23)
CO2: 26 mEq/L (ref 19–32)
Calcium: 9.7 mg/dL (ref 8.4–10.5)
Chloride: 101 mEq/L (ref 96–112)
Creatinine, Ser: 1.01 mg/dL (ref 0.50–1.10)
GFR calc Af Amer: 81 mL/min — ABNORMAL LOW (ref >90)
GFR calc non Af Amer: 70 mL/min — ABNORMAL LOW (ref >90)
Glucose, Bld: 84 mg/dL (ref 70–99)
Potassium: 4 mEq/L (ref 3.5–5.1)
Sodium: 137 mEq/L (ref 135–145)
Total Bilirubin: 0.2 mg/dL — ABNORMAL LOW (ref 0.3–1.2)
Total Protein: 7.2 g/dL (ref 6.0–8.3)

## 2012-05-12 LAB — URINALYSIS, ROUTINE W REFLEX MICROSCOPIC
Bilirubin Urine: NEGATIVE
Glucose, UA: NEGATIVE mg/dL
Hgb urine dipstick: NEGATIVE
Ketones, ur: NEGATIVE mg/dL
Leukocytes, UA: NEGATIVE
Nitrite: NEGATIVE
Protein, ur: NEGATIVE mg/dL
Specific Gravity, Urine: 1.013 (ref 1.005–1.030)
Urobilinogen, UA: 0.2 mg/dL (ref 0.0–1.0)
pH: 5.5 (ref 5.0–8.0)

## 2012-05-12 LAB — LIPASE, BLOOD: Lipase: 27 U/L (ref 11–59)

## 2012-05-12 MED ORDER — ONDANSETRON HCL 4 MG/2ML IJ SOLN
4.0000 mg | Freq: Once | INTRAMUSCULAR | Status: AC
  Administered 2012-05-12: 4 mg via INTRAVENOUS

## 2012-05-12 MED ORDER — ONDANSETRON HCL 4 MG/2ML IJ SOLN
INTRAMUSCULAR | Status: AC
  Administered 2012-05-12: 4 mg via INTRAVENOUS
  Filled 2012-05-12: qty 2

## 2012-05-12 NOTE — ED Notes (Signed)
Pt c/o epigastric pain radiating to bilat flank pain x 2 days. Emesis x 3 today. Diarrhea x 1 today

## 2012-05-13 MED ORDER — FAMOTIDINE IN NACL 20-0.9 MG/50ML-% IV SOLN
20.0000 mg | Freq: Once | INTRAVENOUS | Status: AC
  Administered 2012-05-13: 20 mg via INTRAVENOUS
  Filled 2012-05-13: qty 50

## 2012-05-13 MED ORDER — SODIUM CHLORIDE 0.9 % IV BOLUS (SEPSIS)
1000.0000 mL | Freq: Once | INTRAVENOUS | Status: AC
  Administered 2012-05-13: 1000 mL via INTRAVENOUS

## 2012-05-13 MED ORDER — PROMETHAZINE HCL 25 MG PO TABS: 25.0000 mg | ORAL_TABLET | Freq: Four times a day (QID) | ORAL | Status: DC | PRN

## 2012-05-13 MED ORDER — KETOROLAC TROMETHAMINE 30 MG/ML IJ SOLN
30.0000 mg | Freq: Once | INTRAMUSCULAR | Status: AC
  Administered 2012-05-13: 30 mg via INTRAVENOUS
  Filled 2012-05-13: qty 1

## 2012-05-13 NOTE — ED Provider Notes (Signed)
History     CSN: [id number]  Arrival date & time 05/12/12  2029   First MD Initiated Contact with Patient 05/12/12 2359      Chief Complaint  Patient presents with  . Abdominal Pain    (Consider location/radiation/quality/duration/timing/severity/associated sxs/prior treatment) HPI Comments: Patient with nausea and vomiting for 2 days and diarrhea today  States the rest of her family has had same over the past week.   She does Have Hx of epigastric pain ans has seen PCP and is awaiting referral to GI for further evaluation of this issue   The history is provided by the patient.    Past Medical History  Diagnosis Date  . Anemia     NOS  . Depression   . Headache     Past Surgical History  Procedure Date  . Vaginal hysterectomy 2002    Prolapse, unsure if she still has ovaries    Family History  Problem Relation Age of Onset  . Diabetes Mother   . Bipolar disorder Mother   . Diabetes Father   . Diabetes Daughter   . ADD / ADHD Son     History  Substance Use Topics  . Smoking status: Current Every Day Smoker -- 0.5 packs/day for 16 years    Types: Cigarettes  . Smokeless tobacco: Never Used  . Alcohol Use: No    OB History    Grav Para Term Preterm Abortions TAB SAB Ect Mult Living                  Review of Systems  Constitutional: Negative for fever and chills.  HENT: Negative.   Eyes: Negative.   Respiratory: Negative for cough and shortness of breath.   Gastrointestinal: Positive for nausea, vomiting, abdominal pain and diarrhea.  Genitourinary: Negative for dysuria and flank pain.  Musculoskeletal: Positive for back pain.  Skin: Negative for wound.    Allergies  Prozac and Penicillins  Home Medications   Current Outpatient Rx  Name  Route  Sig  Dispense  Refill  . ALBUTEROL SULFATE HFA 108 (90 BASE) MCG/ACT IN AERS   Inhalation   Inhale 2 puffs into the lungs every 6 (six) hours as needed. FOR SHORTNESS OF BREATH   1 Inhaler   0   . B  COMPLEX VITAMINS PO CAPS   Oral   Take 1 capsule by mouth daily.         Marland Kitchen CALCIUM GLUCONATE 500 MG PO TABS   Oral   Take 500 mg by mouth daily.         Marland Kitchen COENZYME Q10 30 MG PO CAPS   Oral   Take 30 mg by mouth daily.         . MULTIVITAMINS PO CAPS   Oral   Take 1 capsule by mouth daily.          Marland Kitchen FISH OIL 500 MG PO CAPS   Oral   Take 500 mg by mouth daily.          Marland Kitchen OMEPRAZOLE 20 MG PO CPDR   Oral   Take 1 capsule (20 mg total) by mouth daily.   30 capsule   1   . ONDANSETRON HCL 4 MG PO TABS   Oral   Take 1 tablet (4 mg total) by mouth every 8 (eight) hours as needed for nausea.   20 tablet   0   . SUCRALFATE 1 G PO TABS   Oral   Take  1 tablet (1 g total) by mouth 4 (four) times daily.   60 tablet   0   . PROMETHAZINE HCL 25 MG PO TABS   Oral   Take 1 tablet (25 mg total) by mouth every 6 (six) hours as needed for nausea.   12 tablet   0     BP 97/55  Pulse 73  Temp 98 F (36.7 C) (Oral)  Resp 18  Ht 5\' 1"  (1.549 m)  SpO2 95%  Physical Exam  Constitutional: She appears well-developed and well-nourished.  HENT:  Head: Normocephalic.  Eyes: Pupils are equal, round, and reactive to light.  Neck: Normal range of motion.  Cardiovascular: Normal rate.   Pulmonary/Chest: Effort normal.  Abdominal: She exhibits no distension. There is tenderness in the epigastric area. There is no rigidity and no guarding.       Pain radiated to back bilaterally   Musculoskeletal: Normal range of motion.  Neurological: She is alert.  Skin: Skin is warm and dry. There is pallor.    ED Course  Procedures (including critical care time)  Labs Reviewed  COMPREHENSIVE METABOLIC PANEL - Abnormal; Notable for the following:    Total Bilirubin 0.2 (*)     GFR calc non Af Amer 70 (*)     GFR calc Af Amer 81 (*)     All other components within normal limits  URINALYSIS, ROUTINE W REFLEX MICROSCOPIC - Abnormal; Notable for the following:    APPearance CLOUDY  (*)     All other components within normal limits  CBC WITH DIFFERENTIAL  LIPASE, BLOOD   No results found.   1. Nausea vomiting and diarrhea       MDM  Resolved tolerating  PO        Arman Filter, NP 05/13/12 [phone]

## 2012-05-15 NOTE — ED Provider Notes (Signed)
Medical screening examination/treatment/procedure(s) were performed by non-physician practitioner and as supervising physician I was immediately available for consultation/collaboration.  Jerris Keltz R. Adelise Buswell, MD 05/15/12 2318 

## 2012-07-24 ENCOUNTER — Ambulatory Visit (INDEPENDENT_AMBULATORY_CARE_PROVIDER_SITE_OTHER): Payer: Self-pay | Admitting: Family Medicine

## 2012-07-24 ENCOUNTER — Encounter: Payer: Self-pay | Admitting: Family Medicine

## 2012-07-24 VITALS — BP 102/58 | HR 75 | Temp 98.5°F | Ht 61.5 in | Wt 122.0 lb

## 2012-07-24 DIAGNOSIS — B9689 Other specified bacterial agents as the cause of diseases classified elsewhere: Secondary | ICD-10-CM

## 2012-07-24 DIAGNOSIS — J019 Acute sinusitis, unspecified: Secondary | ICD-10-CM

## 2012-07-24 MED ORDER — AZITHROMYCIN 250 MG PO TABS: ORAL_TABLET | ORAL | Status: DC

## 2012-07-24 NOTE — Progress Notes (Signed)
Subjective:    Patient ID: Erika Brock, female    DOB: 1975-10-02, 37 y.o.   MRN: [id number]  HPI Here for uri symptoms Cough- causing mucous in throat Headache- facial  Nose is dry and congested  Yellow/ green mucous with blood in it  No fever -no aches or chills  Glands are swollen    This started Friday - for the most part   Is a smoker - 5-8 cigarettes per day - plans to quit in the future (no quit date yet)  mucinex does not work well for her  Sudafed tends to help a lot more   Feels like a sinus infection - she gets these easily from colds since she is a smoker  Patient Active Problem List  Diagnosis  . ANEMIA-NOS  . POOR CONCENTRATION  . DEPRESSION  . GASTROENTERITIS WITHOUT DEHYDRATION  . HEADACHE  . ANEMIA, HX OF  . Gastroenteritis   Past Medical History  Diagnosis Date  . Anemia     NOS  . Depression   . Headache    Past Surgical History  Procedure Laterality Date  . Vaginal hysterectomy  2002    Prolapse, unsure if she still has ovaries   History  Substance Use Topics  . Smoking status: Current Every Day Smoker -- 0.50 packs/day for 16 years    Types: Cigarettes  . Smokeless tobacco: Never Used  . Alcohol Use: No   Family History  Problem Relation Age of Onset  . Diabetes Mother   . Bipolar disorder Mother   . Diabetes Father   . Diabetes Daughter   . ADD / ADHD Son    Allergies  Allergen Reactions  . Prozac (Fluoxetine Hcl) Swelling  . Penicillins     REACTION: Severe reaction.   Current Outpatient Prescriptions on File Prior to Visit  Medication Sig Dispense Refill  . albuterol (PROVENTIL HFA;VENTOLIN HFA) 108 (90 BASE) MCG/ACT inhaler Inhale 2 puffs into the lungs every 6 (six) hours as needed. FOR SHORTNESS OF BREATH  1 Inhaler  0  . omeprazole (PRILOSEC) 20 MG capsule Take 1 capsule (20 mg total) by mouth daily.  30 capsule  1  . ondansetron (ZOFRAN) 4 MG tablet Take 1 tablet (4 mg total) by mouth every 8 (eight) hours as  needed for nausea.  20 tablet  0  . promethazine (PHENERGAN) 25 MG tablet Take 1 tablet (25 mg total) by mouth every 6 (six) hours as needed for nausea.  12 tablet  0   No current facility-administered medications on file prior to visit.     Review of Systems Review of Systems  Constitutional: Negative for fever, appetite change,  and unexpected weight change.  ENT pos for congestion and sinus pain ,neg for st Eyes: Negative for pain and visual disturbance.  Respiratory: Negative for wheeze and shortness of breath.   Cardiovascular: Negative for cp or palpitations    Gastrointestinal: Negative for nausea, diarrhea and constipation.  Genitourinary: Negative for urgency and frequency.  Skin: Negative for pallor or rash   Neurological: Negative for weakness, light-headedness, numbness and headaches.  Hematological: Negative for adenopathy. Does not bruise/bleed easily.  Psychiatric/Behavioral: Negative for dysphoric mood. The patient is not nervous/anxious.         Objective:   Physical Exam  Constitutional: She appears well-developed and well-nourished. No distress.  HENT:  Head: Normocephalic and atraumatic.  Right Ear: External ear normal.  Left Ear: External ear normal.  Mouth/Throat: Oropharynx is clear and moist.  No oropharyngeal exudate.  Nares are injected and congested  Clear post nasal drip  bilat ethmoid and maxillary sinus tenderness  Eyes: Conjunctivae and EOM are normal. Pupils are equal, round, and reactive to light. Right eye exhibits no discharge. Left eye exhibits no discharge.  Neck: Normal range of motion. Neck supple.  Cardiovascular: Normal rate and regular rhythm.   Pulmonary/Chest: Effort normal and breath sounds normal. No respiratory distress. She has no wheezes. She has no rales.  Diffusely distant bs   Lymphadenopathy:    She has no cervical adenopathy.  Neurological: She is alert. She has normal reflexes. No cranial nerve deficit.  Skin: Skin is warm  and dry. No rash noted.  Psychiatric: She has a normal mood and affect.          Assessment & Plan:

## 2012-07-24 NOTE — Patient Instructions (Addendum)
For sinus infection-take the zithromax as directed  Drink lots of fluids It will take a while for the virus that started this to run its course  If you wheeze - use your inhaler - but let us know if it gets bad  I'm glad you are working towards quitting smoking  Nasal saline spray can help congestion Sudafed can help congestion as long as it does not make you too jittery

## 2012-07-25 ENCOUNTER — Ambulatory Visit: Payer: Self-pay | Admitting: Family Medicine

## 2012-10-03 ENCOUNTER — Emergency Department (HOSPITAL_COMMUNITY): Payer: Self-pay

## 2012-10-03 ENCOUNTER — Emergency Department (HOSPITAL_COMMUNITY)
Admission: EM | Admit: 2012-10-03 | Discharge: 2012-10-03 | Disposition: A | Discharge status: Home / Self Care | Payer: Self-pay | Attending: Emergency Medicine | Admitting: Emergency Medicine

## 2012-10-03 ENCOUNTER — Encounter (HOSPITAL_COMMUNITY): Payer: Self-pay | Admitting: *Deleted

## 2012-10-03 DIAGNOSIS — M25511 Pain in right shoulder: Secondary | ICD-10-CM

## 2012-10-03 DIAGNOSIS — Y939 Activity, unspecified: Secondary | ICD-10-CM | POA: Insufficient documentation

## 2012-10-03 DIAGNOSIS — Z79899 Other long term (current) drug therapy: Secondary | ICD-10-CM | POA: Insufficient documentation

## 2012-10-03 DIAGNOSIS — S46909A Unspecified injury of unspecified muscle, fascia and tendon at shoulder and upper arm level, unspecified arm, initial encounter: Secondary | ICD-10-CM | POA: Insufficient documentation

## 2012-10-03 DIAGNOSIS — S4980XA Other specified injuries of shoulder and upper arm, unspecified arm, initial encounter: Secondary | ICD-10-CM | POA: Insufficient documentation

## 2012-10-03 DIAGNOSIS — S0990XA Unspecified injury of head, initial encounter: Secondary | ICD-10-CM | POA: Insufficient documentation

## 2012-10-03 DIAGNOSIS — F17200 Nicotine dependence, unspecified, uncomplicated: Secondary | ICD-10-CM | POA: Insufficient documentation

## 2012-10-03 DIAGNOSIS — S0993XA Unspecified injury of face, initial encounter: Secondary | ICD-10-CM | POA: Insufficient documentation

## 2012-10-03 DIAGNOSIS — S6990XA Unspecified injury of unspecified wrist, hand and finger(s), initial encounter: Secondary | ICD-10-CM | POA: Insufficient documentation

## 2012-10-03 DIAGNOSIS — S59909A Unspecified injury of unspecified elbow, initial encounter: Secondary | ICD-10-CM | POA: Insufficient documentation

## 2012-10-03 DIAGNOSIS — M25531 Pain in right wrist: Secondary | ICD-10-CM

## 2012-10-03 DIAGNOSIS — Z862 Personal history of diseases of the blood and blood-forming organs and certain disorders involving the immune mechanism: Secondary | ICD-10-CM | POA: Insufficient documentation

## 2012-10-03 DIAGNOSIS — W108XXA Fall (on) (from) other stairs and steps, initial encounter: Secondary | ICD-10-CM | POA: Insufficient documentation

## 2012-10-03 DIAGNOSIS — Z8659 Personal history of other mental and behavioral disorders: Secondary | ICD-10-CM | POA: Insufficient documentation

## 2012-10-03 DIAGNOSIS — W19XXXA Unspecified fall, initial encounter: Secondary | ICD-10-CM

## 2012-10-03 DIAGNOSIS — Z880 Allergy status to penicillin: Secondary | ICD-10-CM | POA: Insufficient documentation

## 2012-10-03 DIAGNOSIS — Y92009 Unspecified place in unspecified non-institutional (private) residence as the place of occurrence of the external cause: Secondary | ICD-10-CM | POA: Insufficient documentation

## 2012-10-03 DIAGNOSIS — M542 Cervicalgia: Secondary | ICD-10-CM

## 2012-10-03 MED ORDER — OXYCODONE-ACETAMINOPHEN 5-325 MG PO TABS: 1.0000 | ORAL_TABLET | ORAL | Status: DC | PRN

## 2012-10-03 MED ORDER — METHOCARBAMOL 500 MG PO TABS: 500.0000 mg | ORAL_TABLET | Freq: Two times a day (BID) | ORAL | Status: DC | PRN

## 2012-10-03 MED ORDER — METHOCARBAMOL 500 MG PO TABS
500.0000 mg | ORAL_TABLET | Freq: Once | ORAL | Status: AC
  Administered 2012-10-03: 500 mg via ORAL
  Filled 2012-10-03: qty 1

## 2012-10-03 MED ORDER — OXYCODONE-ACETAMINOPHEN 5-325 MG PO TABS
1.0000 | ORAL_TABLET | Freq: Once | ORAL | Status: AC
  Administered 2012-10-03: 1 via ORAL
  Filled 2012-10-03: qty 1

## 2012-10-03 NOTE — ED Provider Notes (Signed)
History     CSN: [id number]  Arrival date & time 10/03/12  0917   First MD Initiated Contact with Patient 10/03/12 [phone]      Chief Complaint  Patient presents with  . Fall    (Consider location/radiation/quality/duration/timing/severity/associated sxs/prior treatment) HPI Comments: Patient presents to the ED for fall.  Patient states she lost her balance and fell backwards down 7 carpeted stairs earlier this morning. When she reached the bottom of the stairs her arms twisted awkwardly. Denies any head trauma or loss of consciousness. Now endorses constant, aching, right posterior neck pain, right shoulder pain, and right wrist pain.  Patient states at home it was hard to turn her neck side to side so she kept it in a neutral position.  Neck pain is causing her to have a headache. Denies any numbness or paresthesias of UE.  Denies any chest pain, SOB, low back pain, or abdominal pain.  Has not taken any meds for her sx PTA.  No prior right shoulder or wrist injury.  The history is provided by the patient.    Past Medical History  Diagnosis Date  . Anemia     NOS  . Depression   . Headache     Past Surgical History  Procedure Laterality Date  . Vaginal hysterectomy  2002    Prolapse, unsure if she still has ovaries    Family History  Problem Relation Age of Onset  . Diabetes Mother   . Bipolar disorder Mother   . Diabetes Father   . Diabetes Daughter   . ADD / ADHD Son     History  Substance Use Topics  . Smoking status: Current Every Day Smoker -- 0.50 packs/day for 16 years    Types: Cigarettes  . Smokeless tobacco: Never Used  . Alcohol Use: No    OB History   Grav Para Term Preterm Abortions TAB SAB Ect Mult Living                  Review of Systems  Musculoskeletal: Positive for arthralgias.  All other systems reviewed and are negative.    Allergies  Prozac and Penicillins  Home Medications   Current Outpatient Rx  Name  Route  Sig  Dispense   Refill  . albuterol (PROVENTIL HFA;VENTOLIN HFA) 108 (90 BASE) MCG/ACT inhaler   Inhalation   Inhale 2 puffs into the lungs every 6 (six) hours as needed. FOR SHORTNESS OF BREATH   1 Inhaler   0   . azithromycin (ZITHROMAX Z-PAK) 250 MG tablet      Take 2 pills by mouth today and then 1 pill daily for 4 days   6 each   0   . calcium carbonate (TUMS - DOSED IN MG ELEMENTAL CALCIUM) 500 MG chewable tablet   Oral   Chew 2 tablets by mouth daily as needed for heartburn.         . Multiple Vitamins-Minerals (ALIVE WOMENS ENERGY) TABS   Oral   Take 1 tablet by mouth.         Marland Kitchen omeprazole (PRILOSEC) 20 MG capsule   Oral   Take 1 capsule (20 mg total) by mouth daily.   30 capsule   1   . ondansetron (ZOFRAN) 4 MG tablet   Oral   Take 1 tablet (4 mg total) by mouth every 8 (eight) hours as needed for nausea.   20 tablet   0   . promethazine (PHENERGAN) 25 MG tablet  Oral   Take 1 tablet (25 mg total) by mouth every 6 (six) hours as needed for nausea.   12 tablet   0     BP 126/68  Pulse 68  Temp(Src) 97.3 F (36.3 C) (Oral)  SpO2 100%  Physical Exam  Nursing note and vitals reviewed. Constitutional: She is oriented to person, place, and time. She appears well-developed and well-nourished.  HENT:  Head: Normocephalic and atraumatic.  Eyes: Conjunctivae and EOM are normal.  Cardiovascular: Normal rate, regular rhythm and normal heart sounds.   Pulmonary/Chest: Effort normal and breath sounds normal. No respiratory distress.  Abdominal: Soft. Bowel sounds are normal. There is no tenderness. There is no guarding.  Musculoskeletal:       Right shoulder: She exhibits decreased range of motion (due to pain), tenderness, bony tenderness and pain. She exhibits no swelling, no effusion, no crepitus, no deformity, no laceration, no spasm, normal pulse and normal strength.       Right wrist: She exhibits decreased range of motion (limited flexion and extension due to pain),  tenderness (diffuse), bony tenderness and swelling (mild). She exhibits no effusion, no crepitus, no deformity and no laceration.       Cervical back: She exhibits decreased range of motion (due to pain), tenderness (right), bony tenderness, pain and spasm (right side of trapezius). She exhibits no swelling, no edema, no deformity, no laceration and normal pulse.  Strong radial pulses and cap refill, normal sensation bilaterally  Neurological: She is alert and oriented to person, place, and time.  Skin: Skin is warm and dry.  Psychiatric: She has a normal mood and affect.    ED Course  Procedures (including critical care time)  Labs Reviewed - No data to display Dg Shoulder Right  10/03/2012   *RADIOLOGY REPORT*  Clinical Data: Fall down stairs, right shoulder pain  RIGHT SHOULDER - 2+ VIEW  Comparison: None.  Findings: No fracture or dislocation is seen.  The joint spaces are preserved.  The visualized soft tissues are unremarkable.  Visualized right lung is clear.  IMPRESSION: No fracture or dislocation is seen.   Original Report Authenticated By: Charline Bills, M.D.   Dg Wrist Complete Right  10/03/2012   *RADIOLOGY REPORT*  Clinical Data: Fall  RIGHT WRIST - COMPLETE 3+ VIEW  Comparison: None.  Findings: No fracture or dislocation is seen.  The joint spaces are preserved.  The visualized soft tissues are unremarkable.  IMPRESSION: No fracture or dislocation is seen.   Original Report Authenticated By: Charline Bills, M.D.   Ct Cervical Spine Wo Contrast  10/03/2012   *RADIOLOGY REPORT*  Clinical Data: Fall, posterior neck pain.  CT CERVICAL SPINE WITHOUT CONTRAST  Technique:  Multidetector CT imaging of the cervical spine was performed. Multiplanar CT image reconstructions were also generated.  Comparison: None.  Findings: Normal alignment.  Prevertebral soft tissues are normal. No fracture.  No epidural or paraspinal hematoma.  IMPRESSION: No acute bony abnormality.   Original Report  Authenticated By: Charlett Nose, M.D.     1. Fall at home, initial encounter   2. Neck pain on right side   3. Shoulder pain, acute, right   4. Wrist pain, acute, right       MDM   37 year old female presenting to the ED for fall. Endorses right posterior neck pain, right shoulder pain, and right wrist pain. No interventions PTA.  Imaging negative for acute fracture or dislocation.  Wrist splint placed in the ED, dose of percocet and  robaxin given- will d/c with the same.  Discussed plan with pt, she agreed.  Return precautions advised.      Garlon Hatchet, PA-C 10/03/12 1116

## 2012-10-03 NOTE — ED Notes (Signed)
Pt states fell down carpeted steps x 7 and landed on carpeted surface this am. C/o right shoulder/arm/hand and neck pain.

## 2012-10-04 ENCOUNTER — Encounter (HOSPITAL_COMMUNITY): Payer: Self-pay | Admitting: Emergency Medicine

## 2012-10-04 ENCOUNTER — Emergency Department (HOSPITAL_COMMUNITY)
Admission: EM | Admit: 2012-10-04 | Discharge: 2012-10-05 | Disposition: A | Discharge status: Home / Self Care | Payer: Self-pay | Attending: Emergency Medicine | Admitting: Emergency Medicine

## 2012-10-04 DIAGNOSIS — Z862 Personal history of diseases of the blood and blood-forming organs and certain disorders involving the immune mechanism: Secondary | ICD-10-CM | POA: Insufficient documentation

## 2012-10-04 DIAGNOSIS — Z79899 Other long term (current) drug therapy: Secondary | ICD-10-CM | POA: Insufficient documentation

## 2012-10-04 DIAGNOSIS — Z880 Allergy status to penicillin: Secondary | ICD-10-CM | POA: Insufficient documentation

## 2012-10-04 DIAGNOSIS — Z8659 Personal history of other mental and behavioral disorders: Secondary | ICD-10-CM | POA: Insufficient documentation

## 2012-10-04 DIAGNOSIS — F0781 Postconcussional syndrome: Secondary | ICD-10-CM | POA: Insufficient documentation

## 2012-10-04 DIAGNOSIS — F17200 Nicotine dependence, unspecified, uncomplicated: Secondary | ICD-10-CM | POA: Insufficient documentation

## 2012-10-04 HISTORY — DX: Migraine, unspecified, not intractable, without status migrainosus: G43.909

## 2012-10-04 NOTE — ED Notes (Signed)
PT. REPORTS HEADACHE /NAUSEA , SEEN HERE YESTERDAY PRESCRIBED WITH PAIN MEDICATIONS S/P FALL YESTERDAY , AMBULATORY / NO LOC.

## 2012-10-05 ENCOUNTER — Telehealth: Payer: Self-pay

## 2012-10-05 ENCOUNTER — Encounter (HOSPITAL_COMMUNITY): Payer: Self-pay | Admitting: Radiology

## 2012-10-05 ENCOUNTER — Emergency Department (HOSPITAL_COMMUNITY): Payer: Self-pay

## 2012-10-05 MED ORDER — OXYCODONE-ACETAMINOPHEN 5-325 MG PO TABS
1.0000 | ORAL_TABLET | Freq: Once | ORAL | Status: AC
  Administered 2012-10-05: 1 via ORAL
  Filled 2012-10-05: qty 1

## 2012-10-05 MED ORDER — KETOROLAC TROMETHAMINE 60 MG/2ML IM SOLN
60.0000 mg | Freq: Once | INTRAMUSCULAR | Status: AC
  Administered 2012-10-05: 60 mg via INTRAMUSCULAR
  Filled 2012-10-05: qty 2

## 2012-10-05 MED ORDER — NAPROXEN 500 MG PO TABS: 500.0000 mg | ORAL_TABLET | Freq: Two times a day (BID) | ORAL | Status: DC

## 2012-10-05 MED ORDER — LORAZEPAM 1 MG PO TABS
1.0000 mg | ORAL_TABLET | Freq: Once | ORAL | Status: AC
  Administered 2012-10-05: 1 mg via ORAL
  Filled 2012-10-05: qty 2

## 2012-10-05 NOTE — ED Notes (Signed)
Pt comfortable with d/c and f/u instructions. Prescriptions x1 

## 2012-10-05 NOTE — ED Provider Notes (Signed)
Medical screening examination/treatment/procedure(s) were performed by non-physician practitioner and as supervising physician I was immediately available for consultation/collaboration.   Gavin Pound. Belem Hintze, MD 10/05/12 1248

## 2012-10-05 NOTE — Telephone Encounter (Signed)
Triage Record Num: [id number] Operator: Roselyn Meier Patient Name: Erika Brock Call Date & Time: 10/04/2012 7:56:44PM Patient Phone: [phone] PCP: Ruthe Mannan Patient Gender: Female PCP Fax : [phone] Patient DOB: Sep 27, 1975 [facility] Name[facility] Reason for Call: Caller: Lorriann/Patient; PCP: Ruthe Mannan (Family Practice); CB#: [phone]; Call regarding [facility] yesterday and is still having a bad h/a and seeing white dots, also vomited 3X today; Onset 10/03/12 with falling downstairs (7 stairs). Pt is having head pain and neck pain. Pt was seen at the ER. Pt had MRI done of shoulder. Pt reports having head pain and has vomited x 3 (pt would not rate her pain but said it "hurt like a migraine"). Pt was prescribed Percocet and Robaxin for pain, pt has not taken since this am because she thought they were making her sick. Emergent symptom of 'Severe or worsening headache since injury' identified in the Head Injury Protocol. RN advised for the patient to be seen again in the ER. Pt verbalized understanding. Pt will go back to [employer]) Used: Head Injury Recommended Outcome per Protocol: See ED Immediately Reason for Outcome: Severe or worsening headache since

## 2012-10-05 NOTE — ED Provider Notes (Signed)
History     CSN: [id number]  Arrival date & time 10/04/12  2051   First MD Initiated Contact with Patient 10/05/12 0119      Chief Complaint  Patient presents with  . Headache    (Consider location/radiation/quality/duration/timing/severity/associated sxs/prior treatment) HPI Erika Brock is a 37 y.o. female that presents emergency department complaining of headache, emesis x2, blurred vision and dizziness is status post fall that occurred yesterday.  Patient was evaluated after falling down several stairs.  Unknown the patient hit head but she denies loss of consciousness.  Patient is able to ambulate without difficulty and denies any weakness or numbness of extremities.  Patient not currently on blood thinners.  Past Medical History  Diagnosis Date  . Anemia     NOS  . Depression   . Headache(784.0)   . Migraine     Past Surgical History  Procedure Laterality Date  . Vaginal hysterectomy  2002    Prolapse, unsure if she still has ovaries    Family History  Problem Relation Age of Onset  . Diabetes Mother   . Bipolar disorder Mother   . Diabetes Father   . Diabetes Daughter   . ADD / ADHD Son     History  Substance Use Topics  . Smoking status: Current Every Day Smoker -- 0.50 packs/day for 16 years    Types: Cigarettes  . Smokeless tobacco: Never Used  . Alcohol Use: No    OB History   Grav Para Term Preterm Abortions TAB SAB Ect Mult Living                  Review of Systems Ten systems reviewed and are negative for acute change, except as noted in the HPI.    Allergies  Prozac and Penicillins  Home Medications   Current Outpatient Rx  Name  Route  Sig  Dispense  Refill  . albuterol (PROVENTIL HFA;VENTOLIN HFA) 108 (90 BASE) MCG/ACT inhaler   Inhalation   Inhale 2 puffs into the lungs every 6 (six) hours as needed for wheezing.         Marland Kitchen ibuprofen (ADVIL,MOTRIN) 200 MG tablet   Oral   Take 800 mg by mouth every 6 (six) hours as needed  for pain.         . methocarbamol (ROBAXIN) 500 MG tablet   Oral   Take 1 tablet (500 mg total) by mouth 2 (two) times daily as needed.   14 tablet   0   . Multiple Vitamins-Minerals (ALIVE WOMENS ENERGY) TABS   Oral   Take 1 tablet by mouth.         . oxyCODONE-acetaminophen (PERCOCET/ROXICET) 5-325 MG per tablet   Oral   Take 1 tablet by mouth every 4 (four) hours as needed for pain.   15 tablet   0   . naproxen (NAPROSYN) 500 MG tablet   Oral   Take 1 tablet (500 mg total) by mouth 2 (two) times daily.   30 tablet   0     BP 117/74  Pulse 76  Temp(Src) 97.7 F (36.5 C) (Oral)  Resp 20  SpO2 98%  Physical Exam  Nursing note and vitals reviewed. Constitutional: She is oriented to person, place, and time. She appears well-developed and well-nourished.  HENT:  Head: Normocephalic and atraumatic.  Eyes: Conjunctivae and EOM are normal. Pupils are equal, round, and reactive to light. No scleral icterus.  Neck: Normal range of motion.  Neck supple  with no nuchal rigidity, full pain free ROM. No carotid bruit  Cardiovascular: Normal rate, regular rhythm, normal heart sounds and intact distal pulses.   Pulmonary/Chest: Effort normal and breath sounds normal. No respiratory distress.  Musculoskeletal: Normal range of motion.  Neurological: She is alert and oriented to person, place, and time. She has normal strength.  CN III-XII intact, good coordination, normal gait, strength 5/5 bilaterally, intact distal sensation.  Skin: Skin is warm and dry.  No rash, non diaphoretic    ED Course  Procedures (including critical care time)  Labs Reviewed - No data to display Dg Shoulder Right  10/03/2012   *RADIOLOGY REPORT*  Clinical Data: Fall down stairs, right shoulder pain  RIGHT SHOULDER - 2+ VIEW  Comparison: None.  Findings: No fracture or dislocation is seen.  The joint spaces are preserved.  The visualized soft tissues are unremarkable.  Visualized right lung is  clear.  IMPRESSION: No fracture or dislocation is seen.   Original Report Authenticated By: Charline Bills, M.D.   Dg Wrist Complete Right  10/03/2012   *RADIOLOGY REPORT*  Clinical Data: Fall  RIGHT WRIST - COMPLETE 3+ VIEW  Comparison: None.  Findings: No fracture or dislocation is seen.  The joint spaces are preserved.  The visualized soft tissues are unremarkable.  IMPRESSION: No fracture or dislocation is seen.   Original Report Authenticated By: Charline Bills, M.D.   Ct Head Wo Contrast  10/05/2012   *RADIOLOGY REPORT*  Clinical Data: Status post fall down seven carpeted stairs; concern for head injury.  CT HEAD WITHOUT CONTRAST  Technique:  Contiguous axial images were obtained from the base of the skull through the vertex without contrast.  Comparison: None.  Findings: There is no evidence of acute infarction, mass lesion, or intra- or extra-axial hemorrhage on CT.  The posterior fossa, including the cerebellum, brainstem and fourth ventricle, is within normal limits.  The third and lateral ventricles, and basal ganglia are unremarkable in appearance.  The cerebral hemispheres are symmetric in appearance, with normal gray- white differentiation.  No mass effect or midline shift is seen.  There is no evidence of fracture; visualized osseous structures are unremarkable in appearance.  The orbits are within normal limits. The paranasal sinuses and mastoid air cells are well-aerated.  No significant soft tissue abnormalities are seen.  IMPRESSION: No evidence of traumatic intracranial injury or fracture.   Original Report Authenticated By: Tonia Ghent, M.D.   Ct Cervical Spine Wo Contrast  10/03/2012   *RADIOLOGY REPORT*  Clinical Data: Fall, posterior neck pain.  CT CERVICAL SPINE WITHOUT CONTRAST  Technique:  Multidetector CT imaging of the cervical spine was performed. Multiplanar CT image reconstructions were also generated.  Comparison: None.  Findings: Normal alignment.  Prevertebral soft  tissues are normal. No fracture.  No epidural or paraspinal hematoma.  IMPRESSION: No acute bony abnormality.   Original Report Authenticated By: Charlett Nose, M.D.     1. Post concussion syndrome       MDM  Patient to ER with postconcussive syndrome status post mechanical fall.  CT head without contrast with no acute intercranial injuries seen.  Syndrome versus return precautions discussed in depth with patient.  Questions answered.  Patient neurovascularly intact with improved symptoms prior to discharge.        Jaci Carrel, [place] 10/05/12 [phone]

## 2012-10-07 NOTE — ED Provider Notes (Signed)
Medical screening examination/treatment/procedure(s) were performed by non-physician practitioner and as supervising physician I was immediately available for consultation/collaboration.   Julie Manly, MD 10/07/12 0018 

## 2012-11-13 ENCOUNTER — Ambulatory Visit: Payer: Self-pay | Admitting: Family Medicine

## 2012-11-16 ENCOUNTER — Encounter: Payer: Self-pay | Admitting: Family Medicine

## 2012-11-16 ENCOUNTER — Ambulatory Visit (INDEPENDENT_AMBULATORY_CARE_PROVIDER_SITE_OTHER): Payer: BC Managed Care – PPO | Admitting: Family Medicine

## 2012-11-16 VITALS — BP 120/80 | HR 80 | Temp 98.0°F | Wt 119.0 lb

## 2012-11-16 DIAGNOSIS — R51 Headache: Secondary | ICD-10-CM

## 2012-11-16 DIAGNOSIS — Z833 Family history of diabetes mellitus: Secondary | ICD-10-CM

## 2012-11-16 DIAGNOSIS — F0781 Postconcussional syndrome: Secondary | ICD-10-CM

## 2012-11-16 LAB — COMPREHENSIVE METABOLIC PANEL
ALT: 14 U/L (ref 0–35)
AST: 16 U/L (ref 0–37)
Albumin: 4.2 g/dL (ref 3.5–5.2)
Alkaline Phosphatase: 62 U/L (ref 39–117)
BUN: 14 mg/dL (ref 6–23)
CO2: 33 mEq/L — ABNORMAL HIGH (ref 19–32)
Calcium: 9.2 mg/dL (ref 8.4–10.5)
Chloride: 105 mEq/L (ref 96–112)
Creatinine, Ser: 1 mg/dL (ref 0.4–1.2)
GFR: 62.71 mL/min (ref >60.00)
Glucose, Bld: 74 mg/dL (ref 70–99)
Potassium: 3.7 mEq/L (ref 3.5–5.1)
Sodium: 136 mEq/L (ref 135–145)
Total Bilirubin: 0.2 mg/dL — ABNORMAL LOW (ref 0.3–1.2)
Total Protein: 7.2 g/dL (ref 6.0–8.3)

## 2012-11-16 LAB — HEMOGLOBIN A1C: Hgb A1c MFr Bld: 5.8 % (ref 4.6–6.5)

## 2012-11-16 NOTE — Progress Notes (Signed)
  Subjective:    Patient ID: Erika Brock, female    DOB: Dec 06, 1975, 37 y.o.   MRN: [id number]  HPI 37 yo female here for persistent HA.  Was seen in ED on 5/27 and 5/28  (notes reviewed) after she slipped and fell down the stairs at home and hit her head.  No LOC.  She does not remember which part of her head she hit.  Imaging was negative and was diagnosed with post concussive syndrome.  Did vomit 4 times the day after the fall.  No recurrent vomiting since.  Was dizzy but that has resolved.  Having a daily frontal headache.  Does not take medications regularly- not sure what has helped.  Not associated with nausea or photophobia but does have decreased appetite when she has one of these headaches. She has been working 3rd shift and not sure if these headaches are related to this change.  She has been having intermittent photophobia with sun exposure only.  Denies any other focal neurological symptoms.  Wants to be screened for DM.  Strong family history.  Has noticed she gets a little jittery when she eats too much sugar.  No increased thirst or urination.   Ct Head Wo Contrast  10/05/2012 *RADIOLOGY REPORT* Clinical Data: Status post fall down seven carpeted stairs; concern for head injury. CT HEAD WITHOUT CONTRAST Technique: Contiguous axial images were obtained from the base of the skull through the vertex without contrast. Comparison: None. Findings: There is no evidence of acute infarction, mass lesion, or intra- or extra-axial hemorrhage on CT. The posterior fossa, including the cerebellum, brainstem and fourth ventricle, is within normal limits. The third and lateral ventricles, and basal ganglia are unremarkable in appearance. The cerebral hemispheres are symmetric in appearance, with normal gray- white differentiation. No mass effect or midline shift is seen. There is no evidence of fracture; visualized osseous structures are unremarkable in appearance. The orbits are within  normal limits. The paranasal sinuses and mastoid air cells are well-aerated. No significant soft tissue abnormalities are seen. IMPRESSION: No evidence of traumatic intracranial injury or fracture. Original Report Authenticated By: Tonia Ghent, M.D.  Ct Cervical Spine Wo Contrast  10/03/2012 *RADIOLOGY REPORT* Clinical Data: Fall, posterior neck pain. CT CERVICAL SPINE WITHOUT CONTRAST Technique: Multidetector CT imaging of the cervical spine was performed. Multiplanar CT image reconstructions were also generated. Comparison: None. Findings: Normal alignment. Prevertebral soft tissues are normal. No fracture. No epidural or paraspinal hematoma. IMPRESSION: No acute bony abnormality. Original Report Authenticated By: Charlett Nose, M.D.    Review of Systems See HPI    Objective:   Physical Exam BP 120/80  Pulse 80  Temp(Src) 98 F (36.7 C)  Wt 119 lb (53.978 kg)  BMI 22.12 kg/m2 Gen: alert, pleasant NAD HEENT:  PERRL, [place] neg Neuro: CN II- XII intact, normal gait, strength equal in all four extremities Psych:  Good eye contact.  Not anxious or depressed appearing.     Assessment & Plan:  1. HEADACHE See below. - Comprehensive metabolic panel  2. Post concussion syndrome Symptoms are improving.   No rx needed at this time. Continue to monitor.  3. Family history of diabetes mellitus  - Hemoglobin A1c

## 2012-11-16 NOTE — Patient Instructions (Addendum)
Great to see you. I think you are doing much better. Keep me posted.  We will call you with your lab results.

## 2012-11-20 ENCOUNTER — Encounter: Payer: Self-pay | Admitting: *Deleted

## 2013-07-04 ENCOUNTER — Encounter: Payer: Self-pay | Admitting: Internal Medicine

## 2013-07-04 ENCOUNTER — Ambulatory Visit (INDEPENDENT_AMBULATORY_CARE_PROVIDER_SITE_OTHER): Payer: BC Managed Care – PPO | Admitting: Internal Medicine

## 2013-07-04 VITALS — BP 118/78 | HR 100 | Temp 97.6°F | Wt 125.0 lb

## 2013-07-04 DIAGNOSIS — J209 Acute bronchitis, unspecified: Secondary | ICD-10-CM

## 2013-07-04 DIAGNOSIS — R52 Pain, unspecified: Secondary | ICD-10-CM

## 2013-07-04 LAB — POCT INFLUENZA A/B
Influenza A, POC: NEGATIVE
Influenza B, POC: NEGATIVE

## 2013-07-04 MED ORDER — HYDROCODONE-HOMATROPINE 5-1.5 MG/5ML PO SYRP: 5.0000 mL | ORAL_SOLUTION | Freq: Three times a day (TID) | ORAL | Status: DC | PRN

## 2013-07-04 MED ORDER — ALBUTEROL SULFATE HFA 108 (90 BASE) MCG/ACT IN AERS: 2.0000 | INHALATION_SPRAY | Freq: Four times a day (QID) | RESPIRATORY_TRACT | Status: DC | PRN

## 2013-07-04 MED ORDER — AZITHROMYCIN 250 MG PO TABS: ORAL_TABLET | ORAL | Status: DC

## 2013-07-04 NOTE — Progress Notes (Signed)
Pre visit review using our clinic review tool, if applicable. No additional management support is needed unless otherwise documented below in the visit note. 

## 2013-07-04 NOTE — Patient Instructions (Addendum)
Acute Bronchitis Bronchitis is inflammation of the airways that extend from the windpipe into the lungs (bronchi). The inflammation often causes mucus to develop. This leads to a cough, which is the most common symptom of bronchitis.  In acute bronchitis, the condition usually develops suddenly and goes away over time, usually in a couple weeks. Smoking, allergies, and asthma can make bronchitis worse. Repeated episodes of bronchitis may cause further lung problems.  CAUSES Acute bronchitis is most often caused by the same virus that causes a cold. The virus can spread from person to person (contagious).  SIGNS AND SYMPTOMS   Cough.   Fever.   Coughing up mucus.   Body aches.   Chest congestion.   Chills.   Shortness of breath.   Sore throat.  DIAGNOSIS  Acute bronchitis is usually diagnosed through a physical exam. Tests, such as chest X-rays, are sometimes done to rule out other conditions.  TREATMENT  Acute bronchitis usually goes away in a couple weeks. Often times, no medical treatment is necessary. Medicines are sometimes given for relief of fever or cough. Antibiotics are usually not needed but may be prescribed in certain situations. In some cases, an inhaler may be recommended to help reduce shortness of breath and control the cough. A cool mist vaporizer may also be used to help thin bronchial secretions and make it easier to clear the chest.  HOME CARE INSTRUCTIONS  Get plenty of rest.   Drink enough fluids to keep your urine clear or pale yellow (unless you have a medical condition that requires fluid restriction). Increasing fluids may help thin your secretions and will prevent dehydration.   Only take over-the-counter or prescription medicines as directed by your health care provider.   Avoid smoking and secondhand smoke. Exposure to cigarette smoke or irritating chemicals will make bronchitis worse. If you are a smoker, consider using nicotine gum or skin  patches to help control withdrawal symptoms. Quitting smoking will help your lungs heal faster.   Reduce the chances of another bout of acute bronchitis by washing your hands frequently, avoiding people with cold symptoms, and trying not to touch your hands to your mouth, nose, or eyes.   Follow up with your health care provider as directed.  SEEK MEDICAL CARE IF: Your symptoms do not improve after 1 week of treatment.  SEEK IMMEDIATE MEDICAL CARE IF:  You develop an increased fever or chills.   You have chest pain.   You have severe shortness of breath.  You have bloody sputum.   You develop dehydration.  You develop fainting.  You develop repeated vomiting.  You develop a severe headache. MAKE SURE YOU:   Understand these instructions.  Will watch your condition.  Will get help right away if you are not doing well or get worse. Document Released: 06/03/2004 Document Revised: 12/27/2012 Document Reviewed: 10/17/2012 [employer] Patient Information 2014 [employer].  

## 2013-07-04 NOTE — Progress Notes (Signed)
HPI  Pt presents to the clinic today with c/o cough nasal congestion and sore throat. She reports this started 2 days ago. The cough is unproductive. It is worse at night. She is blowing green mucous out of her nose. She hasn't taken anything OTC for her symptoms. She does reports that she has been exposed to the flu by her son. She did not get the flu shot. She is a current smoker.  Review of Systems      Past Medical History  Diagnosis Date  . Anemia     NOS  . Depression   . Headache(784.0)   . Migraine     Family History  Problem Relation Age of Onset  . Diabetes Mother   . Bipolar disorder Mother   . Diabetes Father   . Diabetes Daughter   . ADD / ADHD Son   . Diabetes Brother     History   Social History  . Marital Status: Married    Spouse Name: N/A    Number of Children: N/A  . Years of Education: N/A   Occupational History  . Not on file.   Social History Main Topics  . Smoking status: Current Every Day Smoker -- 0.50 packs/day for 16 years    Types: Cigarettes  . Smokeless tobacco: Never Used  . Alcohol Use: No  . Drug Use: No  . Sexual Activity: Not on file   Other Topics Concern  . Not on file   Social History Narrative   Lives with husband and 4 children in [place].  Moved from [place] 3 years ago.      Allergies  Allergen Reactions  . Prozac [Fluoxetine Hcl] Swelling  . Penicillins Hives, Swelling and Other (See Comments)    REACTION: Severe reaction.     Constitutional: Positive headache, fatigue. Denies fever or abrupt weight changes.  HEENT:  Positive nasal congestion, sore throat. Denies eye redness, eye pain, pressure behind the eyes, facial pain,  ear pain, ringing in the ears, wax buildup, runny nose or bloody nose. Respiratory: Positive cough. Denies difficulty breathing or shortness of breath.  Cardiovascular: Denies chest pain, chest tightness, palpitations or swelling in the hands or feet.   No other specific complaints  in a complete review of systems (except as listed in HPI above).  Objective:   BP 118/78  Pulse 100  Temp(Src) 97.6 F (36.4 C) (Oral)  Wt 125 lb (56.7 kg)  SpO2 98% Wt Readings from Last 3 Encounters:  07/04/13 125 lb (56.7 kg)  11/16/12 119 lb (53.978 kg)  07/24/12 122 lb (55.339 kg)     General: Appears her stated age, well developed, well nourished in NAD. HEENT: Head: normal shape and size; Eyes: sclera white, no icterus, conjunctiva pink, PERRLA and EOMs intact; Ears: Tm's gray and intact, normal light reflex; Nose: mucosa pink and moist, septum midline; Throat/Mouth: + PND. Teeth present, mucosa erythematous and moist, no exudate noted, no lesions or ulcerations noted.  Neck: Neck supple, trachea midline. No massses, lumps or thyromegaly present.  Cardiovascular: Normal rate and rhythm. S1,S2 noted.  No murmur, rubs or gallops noted. No JVD or BLE edema. No carotid bruits noted. Pulmonary/Chest: Normal effort and rhonchi in the left lower lobe. No respiratory distress. No rales or wheezing noted.      Assessment & Plan:   Acute Bronchitis:  Flu test: negative eRx for azithromax x 5 days eRx for Hycodan cough suppressant Albuterol inhaler refilled Supportive care at this time: fluids, rest,  tylenol and Mucinex   RTC as needed or if symptoms persist or worsen

## 2013-07-06 ENCOUNTER — Telehealth: Payer: Self-pay | Admitting: Family Medicine

## 2013-07-06 NOTE — Telephone Encounter (Signed)
Relevant patient education assigned to patient using Emmi. ° °

## 2013-09-06 ENCOUNTER — Encounter: Payer: BC Managed Care – PPO | Admitting: Family Medicine

## 2013-09-13 ENCOUNTER — Encounter: Payer: Self-pay | Admitting: Family Medicine

## 2013-09-13 ENCOUNTER — Ambulatory Visit (INDEPENDENT_AMBULATORY_CARE_PROVIDER_SITE_OTHER): Payer: BC Managed Care – PPO | Admitting: Family Medicine

## 2013-09-13 ENCOUNTER — Encounter: Payer: Self-pay | Admitting: *Deleted

## 2013-09-13 VITALS — BP 128/82 | HR 81 | Temp 97.8°F | Ht 62.0 in | Wt 125.0 lb

## 2013-09-13 DIAGNOSIS — F3289 Other specified depressive episodes: Secondary | ICD-10-CM

## 2013-09-13 DIAGNOSIS — Z0000 Encounter for general adult medical examination without abnormal findings: Secondary | ICD-10-CM

## 2013-09-13 DIAGNOSIS — F329 Major depressive disorder, single episode, unspecified: Secondary | ICD-10-CM

## 2013-09-13 DIAGNOSIS — R1032 Left lower quadrant pain: Secondary | ICD-10-CM

## 2013-09-13 DIAGNOSIS — Z1231 Encounter for screening mammogram for malignant neoplasm of breast: Secondary | ICD-10-CM

## 2013-09-13 DIAGNOSIS — Z136 Encounter for screening for cardiovascular disorders: Secondary | ICD-10-CM

## 2013-09-13 LAB — LIPID PANEL
Cholesterol: 152 mg/dL (ref 0–200)
HDL: 46.6 mg/dL (ref >39.00)
LDL Cholesterol: 84 mg/dL (ref 0–99)
Total CHOL/HDL Ratio: 3
Triglycerides: 107 mg/dL (ref 0.0–149.0)
VLDL: 21.4 mg/dL (ref 0.0–40.0)

## 2013-09-13 LAB — COMPREHENSIVE METABOLIC PANEL
ALT: 18 U/L (ref 0–35)
AST: 16 U/L (ref 0–37)
Albumin: 4.2 g/dL (ref 3.5–5.2)
Alkaline Phosphatase: 58 U/L (ref 39–117)
BUN: 9 mg/dL (ref 6–23)
CO2: 28 mEq/L (ref 19–32)
Calcium: 9.3 mg/dL (ref 8.4–10.5)
Chloride: 108 mEq/L (ref 96–112)
Creatinine, Ser: 0.9 mg/dL (ref 0.4–1.2)
GFR: 71.04 mL/min (ref >60.00)
Glucose, Bld: 83 mg/dL (ref 70–99)
Potassium: 4.2 mEq/L (ref 3.5–5.1)
Sodium: 141 mEq/L (ref 135–145)
Total Bilirubin: 0.7 mg/dL (ref 0.2–1.2)
Total Protein: 7 g/dL (ref 6.0–8.3)

## 2013-09-13 LAB — CBC WITH DIFFERENTIAL/PLATELET
Basophils Absolute: 0.1 10*3/uL (ref 0.0–0.1)
Basophils Relative: 0.7 % (ref 0.0–3.0)
Eosinophils Absolute: 0.1 10*3/uL (ref 0.0–0.7)
Eosinophils Relative: 0.7 % (ref 0.0–5.0)
HCT: 41.2 % (ref 36.0–46.0)
Hemoglobin: 13.9 g/dL (ref 12.0–15.0)
Lymphocytes Relative: 30.6 % (ref 12.0–46.0)
Lymphs Abs: 2.9 10*3/uL (ref 0.7–4.0)
MCHC: 33.8 g/dL (ref 30.0–36.0)
MCV: 88.7 fl (ref 78.0–100.0)
Monocytes Absolute: 0.4 10*3/uL (ref 0.1–1.0)
Monocytes Relative: 4.8 % (ref 3.0–12.0)
Neutro Abs: 5.9 10*3/uL (ref 1.4–7.7)
Neutrophils Relative %: 63.2 % (ref 43.0–77.0)
Platelets: 232 10*3/uL (ref 150.0–400.0)
RBC: 4.65 Mil/uL (ref 3.87–5.11)
RDW: 12.6 % (ref 11.5–15.5)
WBC: 9.3 10*3/uL (ref 4.0–10.5)

## 2013-09-13 LAB — TSH: TSH: 2 u[IU]/mL (ref 0.35–4.50)

## 2013-09-13 NOTE — Assessment & Plan Note (Signed)
Reviewed preventive care protocols, scheduled due services, and updated immunizations Discussed nutrition, exercise, diet, and healthy lifestyle.   Orders Placed This Encounter  Procedures  . MM Digital Screening  . US Pelvis Complete  . US Transvaginal Non-OB  . CBC with Differential  . Comprehensive metabolic panel  . Lipid panel  . TSH

## 2013-09-13 NOTE — Progress Notes (Signed)
Pre visit review using our clinic review tool, if applicable. No additional management support is needed unless otherwise documented below in the visit note. 

## 2013-09-13 NOTE — Patient Instructions (Signed)
Great to see you. We will call you with your lab results.  Please call to set up your mammogram after your birthday.  Please go to front desk and let them know you need to set up a referral (your ultrasound) or they will call if you if this is not an urgent referral.  Either Erika Brock or Erika Brock will help you set it up.  If it is between 1-2 PM they may be at lunch.

## 2013-09-13 NOTE — Assessment & Plan Note (Signed)
?  ovarian cysts. Will order transvaginal and pelvic ultrasound. The patient indicates understanding of these issues and agrees with the plan.

## 2013-09-13 NOTE — Progress Notes (Signed)
Subjective:   Patient ID: Erika Brock, female    DOB: [birth year], 38 y.o.   MRN: [id number]  Erika Brock is a pleasant 38 y.o. year old female who presents to clinic today with Annual Exam  on 09/13/2013  HPI:  H/o of hysterectomy in 2002. She has been having some intermittent left lower quadrant abdominal pain (10/10) over past 7 or 8 months. No associated n/v/d. No blood in stool No fevers.  Usually resolves on its own within 15 minutes.  Depression- diagnosed with depression 11 years ago. Has been on multiple antidepressants in past, most recently Lexapro 10 mg daily 2 years ago.   Now is exercising and feels better than ever without rx.  Patient Active Problem List   Diagnosis Date Noted  . Routine general medical examination at a health care facility 09/13/2013  . Abdominal pain, left lower quadrant 09/13/2013  . POOR CONCENTRATION 02/17/2009  . DEPRESSION 02/17/2009  . ANEMIA, HX OF 02/17/2009   Past Medical History  Diagnosis Date  . Anemia     NOS  . Depression   . Headache(784.0)   . Migraine    Past Surgical History  Procedure Laterality Date  . Vaginal hysterectomy  2002    Prolapse, unsure if she still has ovaries   History  Substance Use Topics  . Smoking status: Current Every Day Smoker -- 0.50 packs/day for 16 years    Types: Cigarettes  . Smokeless tobacco: Never Used     Comment: smokes between 5-10 cigaretts a day  . Alcohol Use: No   Family History  Problem Relation Age of Onset  . Diabetes Mother   . Bipolar disorder Mother   . Diabetes Father   . Diabetes Daughter   . ADD / ADHD Son   . Diabetes Brother    Allergies  Allergen Reactions  . Prozac [Fluoxetine Hcl] Swelling  . Penicillins Hives, Swelling and Other (See Comments)    REACTION: Severe reaction.   Current Outpatient Prescriptions on File Prior to Visit  Medication Sig Dispense Refill  . Multiple Vitamins-Minerals (ALIVE WOMENS ENERGY) TABS Take 1 tablet by mouth.       Marland Kitchen. albuterol (PROVENTIL HFA;VENTOLIN HFA) 108 (90 BASE) MCG/ACT inhaler Inhale 2 puffs into the lungs every 6 (six) hours as needed for wheezing.  1 Inhaler  0   No current facility-administered medications on file prior to visit.   The PMH, PSH, Social History, Family History, Medications, and allergies [facility], and have been updated if relevant.   Review of Systems    see HPI Patient reports no  vision/ hearing changes,anorexia, weight change, fever ,adenopathy, persistant / recurrent hoarseness, swallowing issues, chest pain, edema,persistant / recurrent cough, hemoptysis, dyspnea(rest, exertional, paroxysmal nocturnal), gastrointestinal  bleeding (melena, rectal bleeding),  excessive heart burn, GU symptoms(dysuria, hematuria, pyuria, voiding/incontinence  Issues) syncope, focal weakness, severe memory loss, concerning skin lesions, depression, anxiety, abnormal bruising/bleeding, major joint swelling, breast masses or abnormal vaginal bleeding.    Objective:    BP 128/82  Pulse 81  Temp(Src) 97.8 F (36.6 C) (Oral)  Ht 5\' 2"  (1.575 m)  Wt 125 lb (56.7 kg)  BMI 22.86 kg/m2  SpO2 98%   Physical Exam   General:  Well-developed,well-nourished,in no acute distress; alert,appropriate and cooperative throughout examination Head:  normocephalic and atraumatic.   Eyes:  vision grossly intact, pupils equal, pupils round, and pupils reactive to light.   Ears:  R ear normal and L ear  normal.   Nose:  no external deformity.   Mouth:  good dentition.   Neck:  No deformities, masses, or tenderness noted.  Lungs:  Normal respiratory effort, chest expands symmetrically. Lungs are clear to auscultation, no crackles or wheezes. Heart:  Normal rate and regular rhythm. S1 and S2 normal without gallop, murmur, click, rub or other extra sounds. Abdomen:  Bowel sounds positive,abdomen soft Left lower quadrant TTP, no rebound or guarding Msk:  No deformity or scoliosis noted  of thoracic or lumbar spine.   Extremities:  No clubbing, cyanosis, edema, or deformity noted with normal full range of motion of all joints.   Neurologic:  alert & oriented X3 and gait normal.   Skin:  Intact without suspicious lesions or rashes Cervical Nodes:  No lymphadenopathy noted Axillary Nodes:  No palpable lymphadenopathy Psych:  Cognition and judgment appear intact. Alert and cooperative with normal attention span and concentration. No apparent delusions, illusions, hallucinations       Assessment & Plan:   Routine general medical examination at a health care facility  DEPRESSION No Follow-up on file.

## 2013-09-21 ENCOUNTER — Ambulatory Visit
Admission: RE | Admit: 2013-09-21 | Discharge: 2013-09-21 | Disposition: A | Discharge status: Home / Self Care | Payer: BC Managed Care – PPO | Source: Ambulatory Visit | Attending: Family Medicine | Admitting: Family Medicine

## 2013-09-21 DIAGNOSIS — R1032 Left lower quadrant pain: Secondary | ICD-10-CM

## 2013-09-24 ENCOUNTER — Other Ambulatory Visit: Payer: Self-pay | Admitting: Family Medicine

## 2013-09-24 DIAGNOSIS — R1032 Left lower quadrant pain: Secondary | ICD-10-CM

## 2013-09-24 DIAGNOSIS — R9389 Abnormal findings on diagnostic imaging of other specified body structures: Secondary | ICD-10-CM

## 2013-10-10 ENCOUNTER — Telehealth: Payer: Self-pay | Admitting: Family Medicine

## 2013-10-10 NOTE — Telephone Encounter (Signed)
Spoke to pt and advised per Dr Aron; pt verbally expressed understanding.  

## 2013-10-10 NOTE — Telephone Encounter (Signed)
If it is very severe, she should go to the ER/urgent care or see someone in our office.  I am out of the office until tomorrow.

## 2013-10-10 NOTE — Telephone Encounter (Signed)
Pt is having sever pain left side/lower abdomen due to ovarian cyst. Pt has taken Advil, helps a little but sharp pain is still there. Pain is pushing down and out and sore to touch. What should pt do? The pain started over night. Please advise

## 2013-10-17 ENCOUNTER — Other Ambulatory Visit: Payer: Self-pay

## 2013-10-18 LAB — CYTOLOGY - PAP

## 2013-10-22 ENCOUNTER — Other Ambulatory Visit: Payer: Self-pay | Admitting: Gastroenterology

## 2013-10-22 DIAGNOSIS — R1032 Left lower quadrant pain: Secondary | ICD-10-CM

## 2013-10-29 ENCOUNTER — Ambulatory Visit
Admission: RE | Admit: 2013-10-29 | Discharge: 2013-10-29 | Disposition: A | Discharge status: Home / Self Care | Payer: BC Managed Care – PPO | Source: Ambulatory Visit | Attending: Gastroenterology | Admitting: Gastroenterology

## 2013-10-29 DIAGNOSIS — R1032 Left lower quadrant pain: Secondary | ICD-10-CM

## 2013-10-29 MED ORDER — IOHEXOL 300 MG/ML  SOLN
100.0000 mL | Freq: Once | INTRAMUSCULAR | Status: AC | PRN
  Administered 2013-10-29: 100 mL via INTRAVENOUS

## 2013-11-21 ENCOUNTER — Other Ambulatory Visit: Payer: BC Managed Care – PPO

## 2013-11-30 ENCOUNTER — Ambulatory Visit
Admission: RE | Admit: 2013-11-30 | Discharge: 2013-11-30 | Disposition: A | Discharge status: Home / Self Care | Payer: BC Managed Care – PPO | Source: Ambulatory Visit | Attending: Family Medicine | Admitting: Family Medicine

## 2013-11-30 DIAGNOSIS — Z1231 Encounter for screening mammogram for malignant neoplasm of breast: Secondary | ICD-10-CM

## 2014-01-07 ENCOUNTER — Ambulatory Visit (INDEPENDENT_AMBULATORY_CARE_PROVIDER_SITE_OTHER): Payer: BC Managed Care – PPO | Admitting: Internal Medicine

## 2014-01-07 ENCOUNTER — Encounter: Payer: Self-pay | Admitting: Internal Medicine

## 2014-01-07 VITALS — BP 110/68 | HR 75 | Temp 98.2°F | Wt 125.5 lb

## 2014-01-07 DIAGNOSIS — F32A Depression, unspecified: Secondary | ICD-10-CM

## 2014-01-07 DIAGNOSIS — G4700 Insomnia, unspecified: Secondary | ICD-10-CM

## 2014-01-07 DIAGNOSIS — F3289 Other specified depressive episodes: Secondary | ICD-10-CM

## 2014-01-07 DIAGNOSIS — F329 Major depressive disorder, single episode, unspecified: Secondary | ICD-10-CM

## 2014-01-07 MED ORDER — ZOLPIDEM TARTRATE 5 MG PO TABS: 5.0000 mg | ORAL_TABLET | Freq: Every evening | ORAL | Status: DC | PRN

## 2014-01-07 MED ORDER — ESCITALOPRAM OXALATE 10 MG PO TABS: 10.0000 mg | ORAL_TABLET | Freq: Every day | ORAL | Status: DC

## 2014-01-07 NOTE — Progress Notes (Signed)
Pre visit review using our clinic review tool, if applicable. No additional management support is needed unless otherwise documented below in the visit note. 

## 2014-01-07 NOTE — Progress Notes (Signed)
Subjective:    Patient ID: Erika Brock, female    DOB: 1975/11/24, 38 y.o.   MRN: [id number]  HPI  Pt presents to the clinic today with c/o depression. This has also been a chronic issue for her but reports that she recently had a tragedy in the family. She does not want to discuss what is going on. She thinks she needs to go back on her lexapro that she was on back in 2012. She also thinks she needs to go back on her sleeping medications because she is having trouble sleeping.   Review of Systems  Past Medical History  Diagnosis Date  . Anemia     NOS  . Depression   . Headache(784.0)   . Migraine     Current Outpatient Prescriptions  Medication Sig Dispense Refill  . albuterol (PROVENTIL HFA;VENTOLIN HFA) 108 (90 BASE) MCG/ACT inhaler Inhale 2 puffs into the lungs every 6 (six) hours as needed for wheezing.  1 Inhaler  0  . Multiple Vitamins-Minerals (ALIVE WOMENS ENERGY) TABS Take 1 tablet by mouth.       No current facility-administered medications for this visit.    Allergies  Allergen Reactions  . Prozac [Fluoxetine Hcl] Swelling  . Penicillins Hives, Swelling and Other (See Comments)    REACTION: Severe reaction.    Family History  Problem Relation Age of Onset  . Diabetes Mother   . Bipolar disorder Mother   . Diabetes Father   . Diabetes Daughter   . ADD / ADHD Son   . Diabetes Brother     History   Social History  . Marital Status: Married    Spouse Name: N/A    Number of Children: N/A  . Years of Education: N/A   Occupational History  . Not on file.   Social History Main Topics  . Smoking status: Current Every Day Smoker -- 0.50 packs/day for 16 years    Types: Cigarettes  . Smokeless tobacco: Never Used     Comment: smokes between 5-10 cigaretts a day  . Alcohol Use: No  . Drug Use: No  . Sexual Activity: Not on file   Other Topics Concern  . Not on file   Social History Narrative   Lives with husband and 4 children in  [place].  Moved from [place] 3 years ago.       Constitutional: Denies fever, malaise, fatigue, headache or abrupt weight changes.  Psych: Pt reports depression. Denies anxiety, SI/HI.  No other specific complaints in a complete review of systems (except as listed in HPI above).     Objective:   Physical Exam   BP 110/68  Pulse 75  Temp(Src) 98.2 F (36.8 C) (Oral)  Wt 125 lb 8 oz (56.926 kg)  SpO2 99% Wt Readings from Last 3 Encounters:  01/07/14 125 lb 8 oz (56.926 kg)  09/13/13 125 lb (56.7 kg)  07/04/13 125 lb (56.7 kg)    General: Appears her stated age, well developed, well nourished in NAD. Cardiovascular: Normal rate and rhythm. S1,S2 noted.  No murmur, rubs or gallops noted. Pulmonary/Chest: Normal effort and positive vesicular breath sounds. No respiratory distress. No wheezes, rales or ronchi noted.  Psychiatric: Mood tearful and affect normal. Behavior is normal. Judgment and thought content normal.     BMET    Component Value Date/Time   NA 141 09/13/2013 1239   K 4.2 09/13/2013 1239   CL 108 09/13/2013 1239   CO2 28 09/13/2013 1239  GLUCOSE 83 09/13/2013 1239   BUN 9 09/13/2013 1239   CREATININE 0.9 09/13/2013 1239   CALCIUM 9.3 09/13/2013 1239   GFRNONAA 70* 05/12/2012 2210   GFRAA 81* 05/12/2012 2210    Lipid Panel     Component Value Date/Time   CHOL 152 09/13/2013 1239   TRIG 107.0 09/13/2013 1239   HDL 46.60 09/13/2013 1239   CHOLHDL 3 09/13/2013 1239   VLDL 21.4 09/13/2013 1239   LDLCALC 84 09/13/2013 1239    CBC    Component Value Date/Time   WBC 9.3 09/13/2013 1239   RBC 4.65 09/13/2013 1239   HGB 13.9 09/13/2013 1239   HCT 41.2 09/13/2013 1239   PLT 232.0 09/13/2013 1239   MCV 88.7 09/13/2013 1239   MCH 30.1 05/12/2012 2210   MCHC 33.8 09/13/2013 1239   RDW 12.6 09/13/2013 1239   LYMPHSABS 2.9 09/13/2013 1239   MONOABS 0.4 09/13/2013 1239   EOSABS 0.1 09/13/2013 1239   BASOSABS 0.1 09/13/2013 1239    Hgb A1C Lab Results  Component Value Date   HGBA1C 5.8 11/16/2012          Assessment & Plan:   Depression and insomnia, situational:  Encouraged her to try to talk with supportive friends or family She declines referral for grief counseling Will restart Lexapro 10 mg daily Will restart Ambien 5 mg PHS prn  RTC in 4-6 weeks prn to reasses

## 2014-01-07 NOTE — Patient Instructions (Addendum)

## 2014-02-07 ENCOUNTER — Encounter: Payer: Self-pay | Admitting: Family Medicine

## 2014-02-07 ENCOUNTER — Ambulatory Visit (INDEPENDENT_AMBULATORY_CARE_PROVIDER_SITE_OTHER): Payer: BC Managed Care – PPO | Admitting: Family Medicine

## 2014-02-07 VITALS — BP 112/62 | HR 82 | Temp 98.2°F | Ht 61.5 in | Wt 120.5 lb

## 2014-02-07 DIAGNOSIS — F32A Depression, unspecified: Secondary | ICD-10-CM

## 2014-02-07 DIAGNOSIS — Z23 Encounter for immunization: Secondary | ICD-10-CM

## 2014-02-07 DIAGNOSIS — F418 Other specified anxiety disorders: Secondary | ICD-10-CM

## 2014-02-07 DIAGNOSIS — F329 Major depressive disorder, single episode, unspecified: Secondary | ICD-10-CM

## 2014-02-07 MED ORDER — ESCITALOPRAM OXALATE 10 MG PO TABS: ORAL_TABLET | ORAL | Status: DC

## 2014-02-07 NOTE — Progress Notes (Signed)
Subjective:   Patient ID: Erika Brock, female    DOB: [birth year], 38 y.o.   MRN: [id number]  Erika Brock is a pleasant 38 y.o. year old female who presents to clinic today with Follow-up  on 02/07/2014  HPI: Very pleasant 38 yo female here to follow up depression.  Saw Nicki Reaper end of August after son was in serious car accident- his friend died in the accident and her son required hip surgery.  Was feeling overwhelmed, tearful and anxious at that time.  Restarted lexapro 10 mg daily and as needed ambien.  Since then, things in her life have become more stressful.  Son was arrested yesterday and her daughter was recently hospitalized for her diabetes.  Feels lexapro is helping.  Remains more tearful and anxious but feels she is handling it the best she can.  Not sleeping well but tries not to take ambien much. Denies SI or HI.  Current Outpatient Prescriptions on File Prior to Visit  Medication Sig Dispense Refill  . albuterol (PROVENTIL HFA;VENTOLIN HFA) 108 (90 BASE) MCG/ACT inhaler Inhale 2 puffs into the lungs every 6 (six) hours as needed for wheezing.  1 Inhaler  0  . Multiple Vitamins-Minerals (ALIVE WOMENS ENERGY) TABS Take 1 tablet by mouth.      . zolpidem (AMBIEN) 5 MG tablet Take 1 tablet (5 mg total) by mouth at bedtime as needed for sleep.  30 tablet  0   No current facility-administered medications on file prior to visit.    Allergies  Allergen Reactions  . Prozac [Fluoxetine Hcl] Swelling  . Penicillins Hives, Swelling and Other (See Comments)    REACTION: Severe reaction.    Past Medical History  Diagnosis Date  . Anemia     NOS  . Depression   . Headache(784.0)   . Migraine     Past Surgical History  Procedure Laterality Date  . Vaginal hysterectomy  2002    Prolapse, unsure if she still has ovaries    Family History  Problem Relation Age of Onset  . Diabetes Mother   . Bipolar disorder Mother   . Diabetes Father   . Diabetes  Daughter   . ADD / ADHD Son   . Diabetes Brother     History   Social History  . Marital Status: Married    Spouse Name: N/A    Number of Children: N/A  . Years of Education: N/A   Occupational History  . Not on file.   Social History Main Topics  . Smoking status: Current Every Day Smoker -- 0.50 packs/day for 16 years    Types: Cigarettes  . Smokeless tobacco: Never Used     Comment: smokes between 5-10 cigaretts a day  . Alcohol Use: No  . Drug Use: No  . Sexual Activity: Not on file   Other Topics Concern  . Not on file   Social History Narrative   Lives with husband and 4 children in [place].  Moved from [place] 3 years ago.     The PMH, PSH, Social History, Family History, Medications, and allergies have been reviewed in [facility], and have been updated if relevant.  Review of Systems    See HPI Decreased appetite- has lost 5 pounds, "forgets to eat" Wt Readings from Last 3 Encounters:  02/07/14 120 lb 8 oz (54.658 kg)  01/07/14 125 lb 8 oz (56.926 kg)  09/13/13 125 lb (56.7 kg)    Objective:    BP 112/62  Pulse 82  Temp(Src) 98.2 F (36.8 C) (Oral)  Ht 5' 1.5" (1.562 m)  Wt 120 lb 8 oz (54.658 kg)  BMI 22.40 kg/m2  SpO2 97% Wt Readings from Last 3 Encounters:  02/07/14 120 lb 8 oz (54.658 kg)  01/07/14 125 lb 8 oz (56.926 kg)  09/13/13 125 lb (56.7 kg)     Physical Exam  Nursing note and vitals reviewed. Constitutional: She appears well-developed and well-nourished. No distress.  Skin: Skin is warm and dry.  Psychiatric:  Tearful but appropriate Good eye contact          Assessment & Plan:   Depression - Plan: escitalopram (LEXAPRO) 10 MG tablet  Depression with anxiety No Follow-up on file.

## 2014-02-07 NOTE — Addendum Note (Signed)
Addended by: Desmond DikeKNIGHT, Ritika Hellickson H on: 02/07/2014 08:28 AM   Modules accepted: Orders

## 2014-02-07 NOTE — Patient Instructions (Signed)
Great to see you. Hang in there.  We are increasing your lexapro to 15 mg daily.  Please update me.

## 2014-02-07 NOTE — Progress Notes (Signed)
Pre visit review using our clinic review tool, if applicable. No additional management support is needed unless otherwise documented below in the visit note. 

## 2014-02-07 NOTE — Assessment & Plan Note (Signed)
Deteriorated likely due to acute stressors. >25 minutes spent in face to face time with patient, >50% spent in counselling or coordination of care Declining psychotherapy for now. Does not want to use ambien for sleep which is appropriate- I advised trying benadryl and or melatonin. Also will try to increase lexapro to 15 mg daily. Follow up in 1 month.

## 2014-05-07 ENCOUNTER — Encounter: Payer: Self-pay | Admitting: Internal Medicine

## 2014-05-07 ENCOUNTER — Ambulatory Visit (INDEPENDENT_AMBULATORY_CARE_PROVIDER_SITE_OTHER): Payer: BC Managed Care – PPO | Admitting: Internal Medicine

## 2014-05-07 VITALS — BP 104/62 | HR 87 | Temp 98.1°F | Wt 128.0 lb

## 2014-05-07 DIAGNOSIS — J209 Acute bronchitis, unspecified: Secondary | ICD-10-CM

## 2014-05-07 MED ORDER — HYDROCODONE-HOMATROPINE 5-1.5 MG/5ML PO SYRP: 5.0000 mL | ORAL_SOLUTION | Freq: Three times a day (TID) | ORAL | Status: DC | PRN

## 2014-05-07 MED ORDER — AZITHROMYCIN 250 MG PO TABS: ORAL_TABLET | ORAL | Status: DC

## 2014-05-07 NOTE — Progress Notes (Signed)
HPI  Pt presents to the clinic today with c/o headache, nasal congestion, sore throat and cough. She reports this started 4 days ago. The cough is non productive but she reports that she can not breath when she lays down. She is blowing yellow mucous out of her nose. She denies fever, but has had chills, body aches, and some associated dizziness at times. She has taken Sudafed, OTC allergy medicine and ibuprofen without relief. She has no history of allergies or breathing problems. She has not had sick contacts that she is aware of. She does smoke.  Review of Systems    Past Medical History  Diagnosis Date  . Anemia     NOS  . Depression   . Headache(784.0)   . Migraine     Family History  Problem Relation Age of Onset  . Diabetes Mother   . Bipolar disorder Mother   . Diabetes Father   . Diabetes Daughter   . ADD / ADHD Son   . Diabetes Brother     History   Social History  . Marital Status: Married    Spouse Name: N/A    Number of Children: N/A  . Years of Education: N/A   Occupational History  . Not on file.   Social History Main Topics  . Smoking status: Current Every Day Smoker -- 0.50 packs/day for 16 years    Types: Cigarettes  . Smokeless tobacco: Never Used     Comment: smokes between 5-10 cigaretts a day  . Alcohol Use: No  . Drug Use: No  . Sexual Activity: Not on file   Other Topics Concern  . Not on file   Social History Narrative   Lives with husband and 4 children in [place].  Moved from [place] 3 years ago.      Allergies  Allergen Reactions  . Prozac [Fluoxetine Hcl] Swelling  . Penicillins Hives, Swelling and Other (See Comments)    REACTION: Severe reaction.     Constitutional: Positive headache, fatigue. Denies fever or abrupt weight changes.  HEENT:  Positive nasal congestion and sore throat. Denies eye redness, ear pain, ringing in the ears, wax buildup, runny nose or bloody nose. Respiratory: Positive cough. Denies difficulty  breathing or shortness of breath.  Cardiovascular: Denies chest pain, chest tightness, palpitations or swelling in the hands or feet.   No other specific complaints in a complete review of systems (except as listed in HPI above).  Objective:  BP 104/62 mmHg  Pulse 87  Temp(Src) 98.1 F (36.7 C) (Oral)  Wt 128 lb (58.06 kg)  SpO2 99%   General: Appears her stated age, ill appearing in NAD. HEENT: Head: normal shape and size, no sinus tenderness noted; Eyes: sclera white, no icterus, conjunctiva pink; Ears: Tm's pink but intact, normal light reflex; Nose: mucosa pink and moist, septum midline; Throat/Mouth: Teeth present, mucosa pink and moist, no exudate noted, no lesions or ulcerations noted.  Neck: No adenopathy noted.  Cardiovascular: Normal rate and rhythm. S1,S2 noted.  No murmur, rubs or gallops noted.  Pulmonary/Chest: Normal effort and scattered rhonchi with bilateral expiratory wheeze noted. No respiratory distress.      Assessment & Plan:   Acute bronchitis  I almost question wether she has COPD eRx for Azithromax x 5 days RX for Hycodan cough syrup Stop smoking She declines eRx for albuterol inhaler at this time  RTC as needed or if symptoms persist.

## 2014-05-07 NOTE — Progress Notes (Signed)
Pre visit review using our clinic review tool, if applicable. No additional management support is needed unless otherwise documented below in the visit note. 

## 2014-05-07 NOTE — Patient Instructions (Signed)

## 2014-08-12 ENCOUNTER — Encounter: Payer: Self-pay | Admitting: Primary Care

## 2014-08-12 ENCOUNTER — Ambulatory Visit (INDEPENDENT_AMBULATORY_CARE_PROVIDER_SITE_OTHER)
Admission: RE | Admit: 2014-08-12 | Discharge: 2014-08-12 | Disposition: A | Discharge status: Home / Self Care | Payer: BLUE CROSS/BLUE SHIELD | Source: Ambulatory Visit | Attending: Primary Care | Admitting: Primary Care

## 2014-08-12 ENCOUNTER — Ambulatory Visit (INDEPENDENT_AMBULATORY_CARE_PROVIDER_SITE_OTHER): Payer: BLUE CROSS/BLUE SHIELD | Admitting: Primary Care

## 2014-08-12 ENCOUNTER — Telehealth: Payer: Self-pay | Admitting: Primary Care

## 2014-08-12 VITALS — BP 128/78 | HR 84 | Temp 97.9°F | Ht 61.5 in | Wt 133.4 lb

## 2014-08-12 DIAGNOSIS — M25571 Pain in right ankle and joints of right foot: Secondary | ICD-10-CM

## 2014-08-12 DIAGNOSIS — M25579 Pain in unspecified ankle and joints of unspecified foot: Secondary | ICD-10-CM | POA: Insufficient documentation

## 2014-08-12 MED ORDER — TRAMADOL HCL 50 MG PO TABS: 50.0000 mg | ORAL_TABLET | Freq: Three times a day (TID) | ORAL | Status: DC | PRN

## 2014-08-12 NOTE — Telephone Encounter (Signed)
Called and notified patient of Kate's comments. Patient verbalized understanding. Patient also request to have a something in writing that she can use the crutches at work later in the week. Please advise.

## 2014-08-12 NOTE — Assessment & Plan Note (Signed)
Right ankle, foot, and lower leg pain. Xrays negative for fracture and ankle mortise is intact. Tramadol and ibuprofen for pain. Patient's right ankle was wrapped in ACE wrap and she was provided crutches in the office today. Rest, ice, elevation. Follow up if no improvement in one week.

## 2014-08-12 NOTE — Telephone Encounter (Signed)
Please notify Ms. Erika Brock that her xray's do not show fracture to her ankle, toes, or foot. She will need to continue to keep her foot and ankle wrapped/supported, use ice, rest, crutches, and pain medication as discussed. Please have her call me if no improvement in the next week.  Thank you.

## 2014-08-12 NOTE — Patient Instructions (Signed)
Please complete xray's prior to leaving. Obtain an ankle brace to support your foot. You will need to rest, apply ice, and elevate your leg. You may take the Tramadol every 8 hours as needed for pain and ibuprofen as needed for pain and inflammation. I will be in touch with you later today regarding your xray's.

## 2014-08-12 NOTE — Progress Notes (Signed)
Subjective:    Patient ID: Erika Brock, female    DOB: 06/28/1975, 39 y.o.   MRN: [id number]  HPI  Erika Brock is a 39 year old female who presents today with a chief complaint of right foot pain since Saturday. Her dogs were fighting Saturday and after breaking up her dogs from the fight she noticed severe pain to the dorsal and plantar surfaces of her foot, pain and swelling to her right second metatarsal, and pain to the medial malleolus. She reports the pain as a "fire" sensation that has not improved since Saturday. She's taken advil, applied ice, and has elevated her foot.  She doesn't remember twisting any certain way, and did not fall. She also has pain to her right knee and lower leg that "feels like a pulled muscle". She worked on her feet all weekend after the injury.  Review of Systems  Constitutional: Negative for fever.  Respiratory: Negative for shortness of breath.   Cardiovascular: Negative for chest pain and palpitations.  Musculoskeletal: Positive for myalgias and arthralgias.       Past Medical History  Diagnosis Date  . Anemia     NOS  . Depression   . Headache(784.0)   . Migraine     History   Social History  . Marital Status: Married    Spouse Name: N/A  . Number of Children: N/A  . Years of Education: N/A   Occupational History  . Not on file.   Social History Main Topics  . Smoking status: Current Every Day Smoker -- 0.50 packs/day for 16 years    Types: Cigarettes  . Smokeless tobacco: Never Used     Comment: smokes between 5-10 cigaretts a day  . Alcohol Use: No  . Drug Use: No  . Sexual Activity: Not on file   Other Topics Concern  . Not on file   Social History Narrative   Lives with husband and 4 children in [place].  Moved from [place] 3 years ago.      Past Surgical History  Procedure Laterality Date  . Vaginal hysterectomy  2002    Prolapse, unsure if she still has ovaries    Family History  Problem Relation Age  of Onset  . Diabetes Mother   . Bipolar disorder Mother   . Diabetes Father   . Diabetes Daughter   . ADD / ADHD Son   . Diabetes Brother     Allergies  Allergen Reactions  . Prozac [Fluoxetine Hcl] Swelling  . Penicillins Hives, Swelling and Other (See Comments)    REACTION: Severe reaction.    Current Outpatient Prescriptions on File Prior to Visit  Medication Sig Dispense Refill  . albuterol (PROVENTIL HFA;VENTOLIN HFA) 108 (90 BASE) MCG/ACT inhaler Inhale 2 puffs into the lungs every 6 (six) hours as needed for wheezing. 1 Inhaler 0  . azithromycin (ZITHROMAX) 250 MG tablet Take 2 tabs today, then 1 tab daily x 4 days 6 tablet 0  . escitalopram (LEXAPRO) 10 MG tablet 1.5 tablets daily 45 tablet 6  . HYDROcodone-homatropine (HYCODAN) 5-1.5 MG/5ML syrup Take 5 mLs by mouth every 8 (eight) hours as needed for cough. 120 mL 0  . Multiple Vitamins-Minerals (ALIVE WOMENS ENERGY) TABS Take 1 tablet by mouth.    . zolpidem (AMBIEN) 5 MG tablet Take 1 tablet (5 mg total) by mouth at bedtime as needed for sleep. 30 tablet 0   No current facility-administered medications on file prior to visit.  BP 128/78 mmHg  Pulse 84  Temp(Src) 97.9 F (36.6 C) (Oral)  Ht 5' 1.5" (1.562 m)  Wt 133 lb 6.4 oz (60.51 kg)  BMI 24.80 kg/m2  SpO2 97%    Objective:   Physical Exam  Constitutional: She is oriented to person, place, and time. She appears well-developed.  Cardiovascular: Normal rate and regular rhythm.   Pulmonary/Chest: Effort normal and breath sounds normal.  Musculoskeletal:       Right ankle: She exhibits decreased range of motion and swelling. She exhibits no deformity and normal pulse. Tenderness. Medial malleolus tenderness found.       Right foot: There is decreased range of motion, tenderness and bony tenderness. There is no swelling and no deformity.  Neurological: She is alert and oriented to person, place, and time.  Skin: Skin is warm and dry.            Assessment & Plan:

## 2014-08-12 NOTE — Progress Notes (Signed)
Pre visit review using our clinic review tool, if applicable. No additional management support is needed unless otherwise documented below in the visit note. 

## 2014-08-13 ENCOUNTER — Telehealth: Payer: Self-pay | Admitting: Primary Care

## 2014-08-13 ENCOUNTER — Encounter: Payer: Self-pay | Admitting: Primary Care

## 2014-08-13 NOTE — Telephone Encounter (Signed)
Called and notified patient that the work note is ready for pick up. Left in front office.

## 2014-08-13 NOTE — Telephone Encounter (Signed)
Please notify Ms. Erika Brock that I have her work note ready for pick up and to call me if she needs anything else!  Thanks!

## 2014-08-20 ENCOUNTER — Encounter: Payer: Self-pay | Admitting: Primary Care

## 2014-08-20 ENCOUNTER — Ambulatory Visit (INDEPENDENT_AMBULATORY_CARE_PROVIDER_SITE_OTHER): Payer: BLUE CROSS/BLUE SHIELD | Admitting: Primary Care

## 2014-08-20 ENCOUNTER — Telehealth: Payer: Self-pay | Admitting: Family Medicine

## 2014-08-20 VITALS — BP 108/64 | HR 98 | Temp 98.1°F | Wt 135.0 lb

## 2014-08-20 DIAGNOSIS — M25571 Pain in right ankle and joints of right foot: Secondary | ICD-10-CM

## 2014-08-20 MED ORDER — PREDNISONE 20 MG PO TABS: ORAL_TABLET | ORAL | Status: DC

## 2014-08-20 NOTE — Assessment & Plan Note (Signed)
Improved ROM and swelling, but now experiences intermittent, sharp, shooting pain to dorsal and lateral foot.  No swelling present. Skin warm, dry, and intact. 2+ DP and PT pulses. Prednisone burst provided for pain, numbness, tingling. Tramadol for moderate pain, ibuprofen for mild pain. Continue to use crutches and ankle brace. Light duty at work until Monday. Follow up if no improvement in one week.

## 2014-08-20 NOTE — Telephone Encounter (Signed)
Spoke with Erika Brock and advised she make a follow up appointment due to numbness/tingling with "cold" sensation to her foot. She is scheduled for 1pm today.

## 2014-08-20 NOTE — Patient Instructions (Signed)
Start Prednisone tablets for pain and inflammation. Take 2 tablets by mouth for 6 days. You may try taking Naproxen twice daily rather than ibuprofen. Continue to rest, elevate, and ice your foot. Continue to use crutches as discussed. Call me if no improvement in one week. We may need to send you to Sports Medicine.  Take Care!

## 2014-08-20 NOTE — Progress Notes (Signed)
Subjective:    Patient ID: Erika Brock, female    DOB: 08-07-1975, 39 y.o.   MRN: [id number]  HPI  Erika Brock is a 39 year old female who presents today for follow up of a right foot injury that occurred on April 2nd. She was evaluated in our office on 08/12/14 and provided ibuprofen, tramadol, ace wrap, and crutches. Her xray's were negative for fracture or abnormalities. She has been wearing an ankle brace, utilizing crutches, elevating her foot, and occasionally applying ice. She's also been on light duty at work. She admits to feeling better overall with a reduction in the swelling and pain. She's still able to bear weight, but reports intermittent, sharp, shooting pain present to the dorsal side of her foot with radiation just distal to patella. She also reports an intermittent cold sensation to her right foot during elevation. She's been taking tramadol and ibuprofen for pain with temporary relief. Denies fevers, chills, and that her ROM is improving.    Review of Systems  Constitutional: Negative for fever and chills.  Musculoskeletal: Positive for arthralgias. Negative for joint swelling.  Skin: Negative for color change and wound.       Past Medical History  Diagnosis Date  . Anemia     NOS  . Depression   . Headache(784.0)   . Migraine     History   Social History  . Marital Status: Married    Spouse Name: N/A  . Number of Children: N/A  . Years of Education: N/A   Occupational History  . Not on file.   Social History Main Topics  . Smoking status: Current Every Day Smoker -- 0.50 packs/day for 16 years    Types: Cigarettes  . Smokeless tobacco: Never Used     Comment: smokes between 5-10 cigaretts a day  . Alcohol Use: No  . Drug Use: No  . Sexual Activity: Not on file   Other Topics Concern  . Not on file   Social History Narrative   Lives with husband and 4 children in [place].  Moved from [place] 3 years ago.      Past Surgical History    Procedure Laterality Date  . Vaginal hysterectomy  2002    Prolapse, unsure if she still has ovaries    Family History  Problem Relation Age of Onset  . Diabetes Mother   . Bipolar disorder Mother   . Diabetes Father   . Diabetes Daughter   . ADD / ADHD Son   . Diabetes Brother     Allergies  Allergen Reactions  . Prozac [Fluoxetine Hcl] Swelling  . Penicillins Hives, Swelling and Other (See Comments)    REACTION: Severe reaction.    Current Outpatient Prescriptions on File Prior to Visit  Medication Sig Dispense Refill  . albuterol (PROVENTIL HFA;VENTOLIN HFA) 108 (90 BASE) MCG/ACT inhaler Inhale 2 puffs into the lungs every 6 (six) hours as needed for wheezing. 1 Inhaler 0  . azithromycin (ZITHROMAX) 250 MG tablet Take 2 tabs today, then 1 tab daily x 4 days 6 tablet 0  . escitalopram (LEXAPRO) 10 MG tablet 1.5 tablets daily 45 tablet 6  . HYDROcodone-homatropine (HYCODAN) 5-1.5 MG/5ML syrup Take 5 mLs by mouth every 8 (eight) hours as needed for cough. 120 mL 0  . Multiple Vitamins-Minerals (ALIVE WOMENS ENERGY) TABS Take 1 tablet by mouth.    . traMADol (ULTRAM) 50 MG tablet Take 1 tablet (50 mg total) by mouth every 8 (eight) hours  as needed. 30 tablet 0  . zolpidem (AMBIEN) 5 MG tablet Take 1 tablet (5 mg total) by mouth at bedtime as needed for sleep. 30 tablet 0   No current facility-administered medications on file prior to visit.    BP 108/64 mmHg  Pulse 98  Temp(Src) 98.1 F (36.7 C) (Oral)  Wt 135 lb (61.236 kg)  SpO2 97%    Objective:   Physical Exam  Constitutional: She is oriented to person, place, and time.  Cardiovascular: Normal rate and regular rhythm.   Pulmonary/Chest: Effort normal and breath sounds normal.  Musculoskeletal:       Right ankle: She exhibits no swelling and no deformity. Tenderness.       Feet:  Shooting pain is present across the dorsal and lateral aspect of her foot. Swelling and ROM have improved since last visit. 2+ DP  and PT. Skin is warm, dry and intact.  Neurological: She is alert and oriented to person, place, and time.  Skin: Skin is warm and dry.  Psychiatric: She has a normal mood and affect.          Assessment & Plan:

## 2014-08-20 NOTE — Telephone Encounter (Signed)
Pt called wanting to know if she needs a follow up appt.  She also needs a note stating that she can return to regular duty at work, since right now she is on light duty.  Pts contact info is her cell, [phone]. Thanks.

## 2014-08-20 NOTE — Telephone Encounter (Signed)
Follow up visit schedule on 08/20/14 @ 1 pm

## 2014-08-20 NOTE — Progress Notes (Signed)
Pre visit review using our clinic review tool, if applicable. No additional management support is needed unless otherwise documented below in the visit note. 

## 2014-08-23 ENCOUNTER — Telehealth: Payer: Self-pay

## 2014-08-23 NOTE — Telephone Encounter (Signed)
Tired to call patient and voicemail is not set up. Will try again later.

## 2014-08-23 NOTE — Telephone Encounter (Signed)
Pt was seen on 08/20/14 and was to cb with update on condition; pt is doing better still using crutches when she walks but gradually putting more weight on rt ankle when walking. Pain is minimal; pt is improving. Pt request cb.

## 2014-08-23 NOTE — Telephone Encounter (Signed)
Please thank [name] for this update. Ask her if she needs another note for light duty at work. I'm happy to write her for another week. Let me know, thanks!

## 2014-08-26 ENCOUNTER — Encounter: Payer: Self-pay | Admitting: Primary Care

## 2014-08-26 NOTE — Telephone Encounter (Signed)
Called and spoken to patient. Notified her that the work note is ready for pick up. Left in front office.

## 2014-08-26 NOTE — Telephone Encounter (Signed)
Note is ready for pick-up at her convenience. Thanks.

## 2014-08-26 NOTE — Telephone Encounter (Signed)
Called and spoken to the patient. She stated that she would like a work note for going back to regular duty. She is feeling much better. Patient wants a call back when work note is ready.

## 2014-09-20 ENCOUNTER — Encounter: Payer: Self-pay | Admitting: Primary Care

## 2014-09-20 ENCOUNTER — Ambulatory Visit (INDEPENDENT_AMBULATORY_CARE_PROVIDER_SITE_OTHER): Payer: BLUE CROSS/BLUE SHIELD | Admitting: Primary Care

## 2014-09-20 ENCOUNTER — Other Ambulatory Visit: Payer: Self-pay | Admitting: Internal Medicine

## 2014-09-20 VITALS — BP 124/78 | HR 97 | Temp 98.0°F | Ht 61.5 in | Wt 132.1 lb

## 2014-09-20 DIAGNOSIS — R05 Cough: Secondary | ICD-10-CM | POA: Diagnosis not present

## 2014-09-20 DIAGNOSIS — R0981 Nasal congestion: Secondary | ICD-10-CM | POA: Diagnosis not present

## 2014-09-20 DIAGNOSIS — R059 Cough, unspecified: Secondary | ICD-10-CM

## 2014-09-20 MED ORDER — BENZONATATE 200 MG PO CAPS: 200.0000 mg | ORAL_CAPSULE | Freq: Three times a day (TID) | ORAL | Status: DC | PRN

## 2014-09-20 MED ORDER — FLUTICASONE PROPIONATE 50 MCG/ACT NA SUSP: 2.0000 | Freq: Every day | NASAL | Status: DC

## 2014-09-20 NOTE — Patient Instructions (Signed)
You may take the Tessalon Pearls three times daily as needed for cough. Use the Hycodan syrup at bedtime for cough and sleep. Use the Flonase daily for nasal congestion. Tylenol for body aches. Push fluid intake (water) and rest. Follow up next Wednesday if no improvement in symptoms.  Upper Respiratory Infection, Adult An upper respiratory infection (URI) is also sometimes known as the common cold. The upper respiratory tract includes the nose, sinuses, throat, trachea, and bronchi. Bronchi are the airways leading to the lungs. Most people improve within 1 week, but symptoms can last up to 2 weeks. A residual cough may last even longer.  CAUSES Many different viruses can infect the tissues lining the upper respiratory tract. The tissues become irritated and inflamed and often become very moist. Mucus production is also common. A cold is contagious. You can easily spread the virus to others by oral contact. This includes kissing, sharing a glass, coughing, or sneezing. Touching your mouth or nose and then touching a surface, which is then touched by another person, can also spread the virus. SYMPTOMS  Symptoms typically develop 1 to 3 days after you come in contact with a cold virus. Symptoms vary from person to person. They may include:  Runny nose.  Sneezing.  Nasal congestion.  Sinus irritation.  Sore throat.  Loss of voice (laryngitis).  Cough.  Fatigue.  Muscle aches.  Loss of appetite.  Headache.  Low-grade fever. DIAGNOSIS  You might diagnose your own cold based on familiar symptoms, since most people get a cold 2 to 3 times a year. Your caregiver can confirm this based on your exam. Most importantly, your caregiver can check that your symptoms are not due to another disease such as strep throat, sinusitis, pneumonia, asthma, or epiglottitis. Blood tests, throat tests, and X-rays are not necessary to diagnose a common cold, but they may sometimes be helpful in excluding  other more serious diseases. Your caregiver will decide if any further tests are required. RISKS AND COMPLICATIONS  You may be at risk for a more severe case of the common cold if you smoke cigarettes, have chronic heart disease (such as heart failure) or lung disease (such as asthma), or if you have a weakened immune system. The very young and very old are also at risk for more serious infections. Bacterial sinusitis, middle ear infections, and bacterial pneumonia can complicate the common cold. The common cold can worsen asthma and chronic obstructive pulmonary disease (COPD). Sometimes, these complications can require emergency medical care and may be life-threatening. PREVENTION  The best way to protect against getting a cold is to practice good hygiene. Avoid oral or hand contact with people with cold symptoms. Wash your hands often if contact occurs. There is no clear evidence that vitamin C, vitamin E, echinacea, or exercise reduces the chance of developing a cold. However, it is always recommended to get plenty of rest and practice good nutrition. TREATMENT  Treatment is directed at relieving symptoms. There is no cure. Antibiotics are not effective, because the infection is caused by a virus, not by bacteria. Treatment may include:  Increased fluid intake. Sports drinks offer valuable electrolytes, sugars, and fluids.  Breathing heated mist or steam (vaporizer or shower).  Eating chicken soup or other clear broths, and maintaining good nutrition.  Getting plenty of rest.  Using gargles or lozenges for comfort.  Controlling fevers with ibuprofen or acetaminophen as directed by your caregiver.  Increasing usage of your inhaler if you have asthma. Zinc  gel and zinc lozenges, taken in the first 24 hours of the common cold, can shorten the duration and lessen the severity of symptoms. Pain medicines may help with fever, muscle aches, and throat pain. A variety of non-prescription medicines  are available to treat congestion and runny nose. Your caregiver can make recommendations and may suggest nasal or lung inhalers for other symptoms.  HOME CARE INSTRUCTIONS   Only take over-the-counter or prescription medicines for pain, discomfort, or fever as directed by your caregiver.  Use a warm mist humidifier or inhale steam from a shower to increase air moisture. This may keep secretions moist and make it easier to breathe.  Drink enough water and fluids to keep your urine clear or pale yellow.  Rest as needed.  Return to work when your temperature has returned to normal or as your caregiver advises. You may need to stay home longer to avoid infecting others. You can also use a face mask and careful hand washing to prevent spread of the virus. SEEK MEDICAL CARE IF:   After the first few days, you feel you are getting worse rather than better.  You need your caregiver's advice about medicines to control symptoms.  You develop chills, worsening shortness of breath, or brown or red sputum. These may be signs of pneumonia.  You develop yellow or brown nasal discharge or pain in the face, especially when you bend forward. These may be signs of sinusitis.  You develop a fever, swollen neck glands, pain with swallowing, or white areas in the back of your throat. These may be signs of strep throat. SEEK IMMEDIATE MEDICAL CARE IF:   You have a fever.  You develop severe or persistent headache, ear pain, sinus pain, or chest pain.  You develop wheezing, a prolonged cough, cough up blood, or have a change in your usual mucus (if you have chronic lung disease).  You develop sore muscles or a stiff neck. Document Released: 10/20/2000 Document Revised: 07/19/2011 Document Reviewed: 08/01/2013 [facility] Patient Information 2015 [employer] [place]. This information is not intended to replace advice given to you by your health care provider. Make sure you discuss any questions you have with your  health care provider.

## 2014-09-20 NOTE — Progress Notes (Signed)
Subjective:    Patient ID: Erika Brock, female    DOB: 1976/02/23, 39 y.o.   MRN: [id number]  HPI  Erika Brock is a 39 year old female who presents today with a chief complaint of cough. The cough has been present since Wednesday night. She also reports nasal congestion, sinus pressure, body aches, postnasal drip, and chills. She took sudafed this morning with some relief and has been taking a daily zyrtec. She did have the flu shot last season. Denies fevers, nausea, shortness of breath.  Review of Systems  Constitutional: Positive for chills. Negative for fever.  HENT: Positive for congestion, postnasal drip, sneezing and sore throat. Negative for ear pain and sinus pressure.   Eyes: Negative for itching.  Respiratory: Negative for shortness of breath.   Cardiovascular: Negative for chest pain.  Gastrointestinal: Negative for nausea and vomiting.  Musculoskeletal: Positive for myalgias.  Neurological: Positive for headaches.       Past Medical History  Diagnosis Date  . Anemia     NOS  . Depression   . Headache(784.0)   . Migraine     History   Social History  . Marital Status: Married    Spouse Name: N/A  . Number of Children: N/A  . Years of Education: N/A   Occupational History  . Not on file.   Social History Main Topics  . Smoking status: Current Every Day Smoker -- 0.50 packs/day for 16 years    Types: Cigarettes  . Smokeless tobacco: Never Used     Comment: smokes between 5-10 cigaretts a day  . Alcohol Use: No  . Drug Use: No  . Sexual Activity: Not on file   Other Topics Concern  . Not on file   Social History Narrative   Lives with husband and 4 children in [place].  Moved from [place] 3 years ago.      Past Surgical History  Procedure Laterality Date  . Vaginal hysterectomy  2002    Prolapse, unsure if she still has ovaries    Family History  Problem Relation Age of Onset  . Diabetes Mother   . Bipolar disorder Mother   .  Diabetes Father   . Diabetes Daughter   . ADD / ADHD Son   . Diabetes Brother     Allergies  Allergen Reactions  . Prozac [Fluoxetine Hcl] Swelling  . Penicillins Hives, Swelling and Other (See Comments)    REACTION: Severe reaction.    Current Outpatient Prescriptions on File Prior to Visit  Medication Sig Dispense Refill  . albuterol (PROVENTIL HFA;VENTOLIN HFA) 108 (90 BASE) MCG/ACT inhaler Inhale 2 puffs into the lungs every 6 (six) hours as needed for wheezing. 1 Inhaler 0  . Multiple Vitamins-Minerals (ALIVE WOMENS ENERGY) TABS Take 1 tablet by mouth.     No current facility-administered medications on file prior to visit.    BP 124/78 mmHg  Pulse 97  Temp(Src) 98 F (36.7 C) (Oral)  Ht 5' 1.5" (1.562 m)  Wt 132 lb 1.9 oz (59.929 kg)  BMI 24.56 kg/m2  SpO2 97%    Objective:   Physical Exam  Constitutional: She is oriented to person, place, and time. She does not appear ill.  HENT:  Right Ear: Tympanic membrane and ear canal normal.  Left Ear: Tympanic membrane and ear canal normal.  Nose: Nose normal.  Mouth/Throat: Oropharynx is clear and moist.  Cobblestone appearance to posterior pharynx.  Eyes: Conjunctivae are normal. Pupils are equal, round, and  reactive to light.  Neck: Neck supple.  Cardiovascular: Normal rate and regular rhythm.   Pulmonary/Chest: Effort normal and breath sounds normal.  Lymphadenopathy:    She has no cervical adenopathy.  Neurological: She is alert and oriented to person, place, and time.  Skin: Skin is warm and dry.          Assessment & Plan:  Upper respiratory tract infection:  Suspect viral involvement at this point. Cough and post nasal drip present on exam, other wise unremarkable. Lungs clear. Supportive measures provided: RX for Flonase and Tessalon Pearls. Educated patient on course of colds and that she will likely feel worse over the next several days but should experience some improvement in one week.  Follow  up as needed.

## 2014-09-20 NOTE — Progress Notes (Signed)
Pre visit review using our clinic review tool, if applicable. No additional management support is needed unless otherwise documented below in the visit note. 

## 2014-12-03 ENCOUNTER — Encounter (HOSPITAL_COMMUNITY): Payer: Self-pay | Admitting: *Deleted

## 2014-12-03 ENCOUNTER — Emergency Department (HOSPITAL_COMMUNITY)
Admission: EM | Admit: 2014-12-03 | Discharge: 2014-12-04 | Disposition: A | Discharge status: Home / Self Care | Payer: BLUE CROSS/BLUE SHIELD | Attending: Emergency Medicine | Admitting: Emergency Medicine

## 2014-12-03 DIAGNOSIS — Z880 Allergy status to penicillin: Secondary | ICD-10-CM | POA: Insufficient documentation

## 2014-12-03 DIAGNOSIS — Z792 Long term (current) use of antibiotics: Secondary | ICD-10-CM | POA: Insufficient documentation

## 2014-12-03 DIAGNOSIS — W57XXXA Bitten or stung by nonvenomous insect and other nonvenomous arthropods, initial encounter: Secondary | ICD-10-CM | POA: Diagnosis not present

## 2014-12-03 DIAGNOSIS — S90462A Insect bite (nonvenomous), left great toe, initial encounter: Secondary | ICD-10-CM | POA: Insufficient documentation

## 2014-12-03 DIAGNOSIS — Z720 Tobacco use: Secondary | ICD-10-CM | POA: Insufficient documentation

## 2014-12-03 DIAGNOSIS — R51 Headache: Secondary | ICD-10-CM | POA: Diagnosis not present

## 2014-12-03 DIAGNOSIS — Y998 Other external cause status: Secondary | ICD-10-CM | POA: Insufficient documentation

## 2014-12-03 DIAGNOSIS — Z8679 Personal history of other diseases of the circulatory system: Secondary | ICD-10-CM | POA: Diagnosis not present

## 2014-12-03 DIAGNOSIS — Z862 Personal history of diseases of the blood and blood-forming organs and certain disorders involving the immune mechanism: Secondary | ICD-10-CM | POA: Insufficient documentation

## 2014-12-03 DIAGNOSIS — R52 Pain, unspecified: Secondary | ICD-10-CM

## 2014-12-03 DIAGNOSIS — Y9289 Other specified places as the place of occurrence of the external cause: Secondary | ICD-10-CM | POA: Insufficient documentation

## 2014-12-03 DIAGNOSIS — R519 Headache, unspecified: Secondary | ICD-10-CM

## 2014-12-03 DIAGNOSIS — Z8659 Personal history of other mental and behavioral disorders: Secondary | ICD-10-CM | POA: Insufficient documentation

## 2014-12-03 DIAGNOSIS — Y9389 Activity, other specified: Secondary | ICD-10-CM | POA: Diagnosis not present

## 2014-12-03 NOTE — ED Notes (Signed)
Pt c/o body aches, fever, chills, headache since this morning. She took advil migraine with no relief.

## 2014-12-04 LAB — CBC WITH DIFFERENTIAL/PLATELET
Basophils Absolute: 0 10*3/uL (ref 0.0–0.1)
Basophils Relative: 1 % (ref 0–1)
Eosinophils Absolute: 0 10*3/uL (ref 0.0–0.7)
Eosinophils Relative: 0 % (ref 0–5)
HCT: 41.9 % (ref 36.0–46.0)
Hemoglobin: 14.3 g/dL (ref 12.0–15.0)
Lymphocytes Relative: 29 % (ref 12–46)
Lymphs Abs: 1.6 10*3/uL (ref 0.7–4.0)
MCH: 30.1 pg (ref 26.0–34.0)
MCHC: 34.1 g/dL (ref 30.0–36.0)
MCV: 88.2 fL (ref 78.0–100.0)
Monocytes Absolute: 0.6 10*3/uL (ref 0.1–1.0)
Monocytes Relative: 10 % (ref 3–12)
Neutro Abs: 3.4 10*3/uL (ref 1.7–7.7)
Neutrophils Relative %: 60 % (ref 43–77)
Platelets: 219 10*3/uL (ref 150–400)
RBC: 4.75 MIL/uL (ref 3.87–5.11)
RDW: 13 % (ref 11.5–15.5)
WBC: 5.6 10*3/uL (ref 4.0–10.5)

## 2014-12-04 LAB — BASIC METABOLIC PANEL
Anion gap: 9 (ref 5–15)
BUN: 10 mg/dL (ref 6–20)
CO2: 24 mmol/L (ref 22–32)
Calcium: 9.3 mg/dL (ref 8.9–10.3)
Chloride: 104 mmol/L (ref 101–111)
Creatinine, Ser: 1.02 mg/dL — ABNORMAL HIGH (ref 0.44–1.00)
GFR calc Af Amer: >60 mL/min (ref >60)
GFR calc non Af Amer: >60 mL/min (ref >60)
Glucose, Bld: 99 mg/dL (ref 65–99)
Potassium: 3.7 mmol/L (ref 3.5–5.1)
Sodium: 137 mmol/L (ref 135–145)

## 2014-12-04 MED ORDER — DOXYCYCLINE HYCLATE 100 MG PO CAPS: 100.0000 mg | ORAL_CAPSULE | Freq: Two times a day (BID) | ORAL | Status: DC

## 2014-12-04 MED ORDER — SODIUM CHLORIDE 0.9 % IV BOLUS (SEPSIS)
1000.0000 mL | Freq: Once | INTRAVENOUS | Status: AC
  Administered 2014-12-04: 1000 mL via INTRAVENOUS

## 2014-12-04 MED ORDER — PROCHLORPERAZINE MALEATE 5 MG PO TABS
5.0000 mg | ORAL_TABLET | Freq: Once | ORAL | Status: AC
  Administered 2014-12-04: 5 mg via ORAL
  Filled 2014-12-04: qty 1

## 2014-12-04 MED ORDER — KETOROLAC TROMETHAMINE 30 MG/ML IJ SOLN
30.0000 mg | Freq: Once | INTRAMUSCULAR | Status: AC
  Administered 2014-12-04: 30 mg via INTRAVENOUS
  Filled 2014-12-04: qty 1

## 2014-12-04 MED ORDER — DIPHENHYDRAMINE HCL 50 MG/ML IJ SOLN
25.0000 mg | Freq: Once | INTRAMUSCULAR | Status: AC
  Administered 2014-12-04: 25 mg via INTRAVENOUS
  Filled 2014-12-04: qty 1

## 2014-12-04 NOTE — ED Provider Notes (Signed)
CSN: [id number]     Arrival date & time 12/03/14  2042 History   First MD Initiated Contact with Patient 12/04/14 0000     Chief Complaint  Patient presents with  . Generalized Body Aches  . Headache  . Fever    HPI   66 YOF presents today with 1 day of generalized body aches, headache, and fever. Pt reports that she woke this morning with diffuse body aches with the development of frontal headache. She reports a history of migraines and reports this feels similar. She notes frontal predominance with some radiation into her trapezius. She states that she had a low grade fever that was resolved with Advil this morning.  She reports some nausea, denies vomiting, chest pain, SOB, abd pain, diarrhea, changes in urine color clarity or characteristics. Pt reports that she was bitten by some insect several days prior with some surrounding redness to the site, she is uncertain as to what it was.     Past Medical History  Diagnosis Date  . Anemia     NOS  . Depression   . Headache(784.0)   . Migraine    Past Surgical History  Procedure Laterality Date  . Vaginal hysterectomy  2002    Prolapse, unsure if she still has ovaries   Family History  Problem Relation Age of Onset  . Diabetes Mother   . Bipolar disorder Mother   . Diabetes Father   . Diabetes Daughter   . ADD / ADHD Son   . Diabetes Brother    History  Substance Use Topics  . Smoking status: Current Every Day Smoker -- 0.50 packs/day for 16 years    Types: Cigarettes  . Smokeless tobacco: Never Used     Comment: smokes between 5-10 cigaretts a day  . Alcohol Use: No   OB History    No data available     Review of Systems  All other systems reviewed and are negative.   Allergies  Prozac and Penicillins  Home Medications   Prior to Admission medications   Medication Sig Start Date End Date Taking? Authorizing Provider  acetaminophen (TYLENOL) 325 MG tablet Take 650 mg by mouth every 6 (six) hours as needed for  mild pain or fever.   Yes Historical Provider, MD  Ibuprofen (ADVIL MIGRAINE PO) Take 2 tablets by mouth daily as needed (headache).   Yes Historical Provider, MD  PROVENTIL HFA 108 (90 BASE) MCG/ACT inhaler INHALE TWO PUFFS BY MOUTH EVERY 6 HOURS AS NEEDED FOR WHEEZING 09/20/14  Yes Lorre Munroe, NP  doxycycline (VIBRAMYCIN) 100 MG capsule Take 1 capsule (100 mg total) by mouth 2 (two) times daily. 12/04/14   [place]akin[place] Rosamond, PA-C   BP 118/68 mmHg  Pulse 104  Temp(Src) 98.4 F (36.9 C) (Oral)  Resp 18  SpO2 98%   Physical Exam  Constitutional: She is oriented to person, place, and time. She appears well-developed and well-nourished.  HENT:  Head: Normocephalic and atraumatic.  Eyes: Conjunctivae are normal. Pupils are equal, round, and reactive to light. Right eye exhibits no discharge. Left eye exhibits no discharge. No scleral icterus.  Neck: Normal range of motion. No JVD present. No tracheal deviation present.  Cardiovascular: Regular rhythm, normal heart sounds and intact distal pulses.  Exam reveals no gallop and no friction rub.   No murmur heard. Pulmonary/Chest: Effort normal and breath sounds normal. No stridor.  Abdominal: Soft. There is no tenderness.  Musculoskeletal: Normal range of motion. She exhibits no  edema or tenderness.  Neurological: She is alert and oriented to person, place, and time. Coordination normal.  Skin:  Small insect bite with surrounding redness to the left great toe. No rashes noted throughout skin  Psychiatric: She has a normal mood and affect. Her behavior is normal. Judgment and thought content normal.  Nursing note and vitals reviewed.   ED Course  Procedures (including critical care time) Labs Review Labs Reviewed  BASIC METABOLIC PANEL - Abnormal; Notable for the following:    Creatinine, Ser 1.02 (*)    All other components within normal limits  CBC WITH DIFFERENTIAL/PLATELET    Imaging Review No results found.   EKG  Interpretation None      MDM   Final diagnoses:  Headache, unspecified headache type  Insect bite  Body aches    Labs: CBC, BMP- no significant findings  Imaging:  Consults:  Therapeutics: Benadryl, Compazine, Toradol  Discharge Meds: Doxycycline  Assessment/Plan: Patient presents with headache and diffuse body aches. She was afebrile, last dose of antipyretic this morning. Labs are reassuring, vital signs reassuring, normal neuro exam. Patient was treated here in the ED with significant symptomatic improvement in her headache. Patient does have a unknown insect bite to her left toe with minor surrounding redness. Due to patient's presentation of reported fever and body aches with a unknown insect bite she will be treated with doxycycline in the event this was a tick. Patient is encouraged to follow-up in 2 days with the primary care provider for reevaluation. She is given strict return precautions, verbalizes understanding and agreement for today's plan and had no further questions or concerns at the time of discharge.         Eyvonne Mechanic, PA-C 12/04/14 1610  Linwood Dibbles, MD 12/04/14 2135

## 2014-12-04 NOTE — Discharge Instructions (Signed)
Please use medication as directed. Please monitor for new or worsening signs or symptoms, follow-up immediately if any present. Please contact her primary care provider inform them of your visit today and all relevant data. Please follow-up with them for further evaluation and management. Please avoid excess exposure to sun as antibiotics can increase and sensitivity.

## 2014-12-05 ENCOUNTER — Emergency Department
Admission: EM | Admit: 2014-12-05 | Discharge: 2014-12-05 | Disposition: A | Discharge status: Home / Self Care | Payer: BLUE CROSS/BLUE SHIELD | Attending: Emergency Medicine | Admitting: Emergency Medicine

## 2014-12-05 ENCOUNTER — Encounter: Payer: Self-pay | Admitting: Family Medicine

## 2014-12-05 ENCOUNTER — Emergency Department: Payer: BLUE CROSS/BLUE SHIELD

## 2014-12-05 ENCOUNTER — Ambulatory Visit (INDEPENDENT_AMBULATORY_CARE_PROVIDER_SITE_OTHER): Payer: BLUE CROSS/BLUE SHIELD | Admitting: Family Medicine

## 2014-12-05 VITALS — BP 118/74 | HR 106 | Temp 99.5°F | Wt 132.8 lb

## 2014-12-05 DIAGNOSIS — Z720 Tobacco use: Secondary | ICD-10-CM | POA: Diagnosis not present

## 2014-12-05 DIAGNOSIS — R6510 Systemic inflammatory response syndrome (SIRS) of non-infectious origin without acute organ dysfunction: Secondary | ICD-10-CM | POA: Insufficient documentation

## 2014-12-05 DIAGNOSIS — R509 Fever, unspecified: Secondary | ICD-10-CM | POA: Diagnosis not present

## 2014-12-05 DIAGNOSIS — R51 Headache: Secondary | ICD-10-CM

## 2014-12-05 DIAGNOSIS — R519 Headache, unspecified: Secondary | ICD-10-CM

## 2014-12-05 DIAGNOSIS — Z79899 Other long term (current) drug therapy: Secondary | ICD-10-CM | POA: Insufficient documentation

## 2014-12-05 DIAGNOSIS — M549 Dorsalgia, unspecified: Secondary | ICD-10-CM | POA: Insufficient documentation

## 2014-12-05 DIAGNOSIS — Z880 Allergy status to penicillin: Secondary | ICD-10-CM | POA: Insufficient documentation

## 2014-12-05 DIAGNOSIS — M542 Cervicalgia: Secondary | ICD-10-CM

## 2014-12-05 LAB — CSF CELL COUNT WITH DIFFERENTIAL
RBC Count, CSF: 3 /mm3 (ref 0–3)
RBC Count, CSF: 3 /mm3 (ref 0–3)
Tube #: 1
Tube #: 4
WBC, CSF: 0 /mm3
WBC, CSF: 0 /mm3

## 2014-12-05 LAB — APTT: aPTT: 33 seconds (ref 24–36)

## 2014-12-05 LAB — CBC WITH DIFFERENTIAL/PLATELET
Basophils Absolute: 0 10*3/uL (ref 0–0.1)
Basophils Relative: 1 %
Eosinophils Absolute: 0 10*3/uL (ref 0–0.7)
Eosinophils Relative: 0 %
HCT: 36.3 % (ref 35.0–47.0)
Hemoglobin: 12.4 g/dL (ref 12.0–16.0)
Lymphocytes Relative: 25 %
Lymphs Abs: 0.9 10*3/uL — ABNORMAL LOW (ref 1.0–3.6)
MCH: 30 pg (ref 26.0–34.0)
MCHC: 34.2 g/dL (ref 32.0–36.0)
MCV: 87.7 fL (ref 80.0–100.0)
Monocytes Absolute: 0.3 10*3/uL (ref 0.2–0.9)
Monocytes Relative: 8 %
Neutro Abs: 2.3 10*3/uL (ref 1.4–6.5)
Neutrophils Relative %: 66 %
Platelets: 157 10*3/uL (ref 150–440)
RBC: 4.14 MIL/uL (ref 3.80–5.20)
RDW: 13 % (ref 11.5–14.5)
WBC: 3.5 10*3/uL — ABNORMAL LOW (ref 3.6–11.0)

## 2014-12-05 LAB — PROTIME-INR
INR: 1.15
Prothrombin Time: 14.9 seconds (ref 11.4–15.0)

## 2014-12-05 LAB — BASIC METABOLIC PANEL
Anion gap: 7 (ref 5–15)
BUN: 6 mg/dL (ref 6–20)
CO2: 24 mmol/L (ref 22–32)
Calcium: 8.4 mg/dL — ABNORMAL LOW (ref 8.9–10.3)
Chloride: 106 mmol/L (ref 101–111)
Creatinine, Ser: 0.73 mg/dL (ref 0.44–1.00)
GFR calc Af Amer: >60 mL/min (ref >60)
GFR calc non Af Amer: >60 mL/min (ref >60)
Glucose, Bld: 86 mg/dL (ref 65–99)
Potassium: 3.5 mmol/L (ref 3.5–5.1)
Sodium: 137 mmol/L (ref 135–145)

## 2014-12-05 LAB — GLUCOSE, CSF: Glucose, CSF: 57 mg/dL (ref 40–70)

## 2014-12-05 LAB — PROTEIN, CSF: Total  Protein, CSF: 12 mg/dL — ABNORMAL LOW (ref 15–45)

## 2014-12-05 MED ORDER — CEFTRIAXONE SODIUM IN DEXTROSE 40 MG/ML IV SOLN
2.0000 g | Freq: Once | INTRAVENOUS | Status: AC
  Administered 2014-12-05: 2 g via INTRAVENOUS
  Filled 2014-12-05: qty 50

## 2014-12-05 MED ORDER — LIDOCAINE-EPINEPHRINE (PF) 1 %-1:200000 IJ SOLN
30.0000 mL | Freq: Once | INTRAMUSCULAR | Status: AC
  Administered 2014-12-05: 30 mL via INTRADERMAL
  Filled 2014-12-05: qty 30

## 2014-12-05 MED ORDER — DEXTROSE 5 % IV SOLN
10.0000 mg/kg | Freq: Once | INTRAVENOUS | Status: AC
  Administered 2014-12-05: 600 mg via INTRAVENOUS
  Filled 2014-12-05: qty 12

## 2014-12-05 MED ORDER — ACETAMINOPHEN 10 MG/ML IV SOLN
1000.0000 mg | INTRAVENOUS | Status: AC
  Administered 2014-12-05: 1000 mg via INTRAVENOUS
  Filled 2014-12-05: qty 100

## 2014-12-05 MED ORDER — VANCOMYCIN HCL 10 G IV SOLR
1250.0000 mg | Freq: Once | INTRAVENOUS | Status: AC
  Administered 2014-12-05: 1250 mg via INTRAVENOUS
  Filled 2014-12-05: qty 1250

## 2014-12-05 MED ORDER — SODIUM CHLORIDE 0.9 % IV SOLN
Freq: Once | INTRAVENOUS | Status: AC
  Administered 2014-12-05: 11:00:00 via INTRAVENOUS

## 2014-12-05 MED ORDER — HYDROMORPHONE HCL 1 MG/ML IJ SOLN
1.0000 mg | Freq: Once | INTRAMUSCULAR | Status: AC
  Administered 2014-12-05: 1 mg via INTRAVENOUS
  Filled 2014-12-05: qty 1

## 2014-12-05 MED ORDER — DOXYCYCLINE HYCLATE 100 MG PO TABS
100.0000 mg | ORAL_TABLET | Freq: Once | ORAL | Status: AC
  Administered 2014-12-05: 100 mg via ORAL
  Filled 2014-12-05: qty 1

## 2014-12-05 MED ORDER — DOXYCYCLINE HYCLATE 100 MG PO CAPS: 100.0000 mg | ORAL_CAPSULE | Freq: Two times a day (BID) | ORAL | Status: DC

## 2014-12-05 MED ORDER — SODIUM CHLORIDE 0.9 % IV BOLUS (SEPSIS)
1000.0000 mL | Freq: Once | INTRAVENOUS | Status: AC
  Administered 2014-12-05: 1000 mL via INTRAVENOUS

## 2014-12-05 MED ORDER — DEXAMETHASONE SODIUM PHOSPHATE 10 MG/ML IJ SOLN
10.0000 mg | Freq: Once | INTRAMUSCULAR | Status: AC
  Administered 2014-12-05: 10 mg via INTRAVENOUS
  Filled 2014-12-05: qty 1

## 2014-12-05 NOTE — Progress Notes (Signed)
Subjective:   Patient ID: Erika Brock, female    DOB: 04/13/1976, 39 y.o.   MRN: [id number]  Erika Brock is a pleasant 39 y.o. year old female who presents to clinic today with Hospitalization Follow-up  on 12/05/2014  HPI:  Was seen in ER yesterday, 7/26 for fever, HA and myalgias. Note reviewed.  Acute onset of fever, frontal headache and myalgias.  Did have a bug bite but was not sure if it was a tick bite.  Normal neuro exam was afebrile in ER.   CBC and CMET unremarkable.   No further work up was done.  Given rx for doxycyline which she did not fill.  She feels she does not have a tick born illness because "I never pulled a tick off."  Does have what appears to be an insect bite on the top of her left foot.  Today, still febrile with chills.  She cannot move her neck without "excruciating pain" and is having photophobia.  No nausea or vomiting but has decreased appetite.  Lab Results  Component Value Date   WBC 5.6 12/04/2014   HGB 14.3 12/04/2014   HCT 41.9 12/04/2014   MCV 88.2 12/04/2014   PLT 219 12/04/2014   Lab Results  Component Value Date   NA 137 12/04/2014   K 3.7 12/04/2014   CL 104 12/04/2014   CO2 24 12/04/2014   Lab Results  Component Value Date   CREATININE 1.02* 12/04/2014   Lab Results  Component Value Date   ALT 18 09/13/2013   AST 16 09/13/2013   ALKPHOS 58 09/13/2013   BILITOT 0.7 09/13/2013   Current Outpatient Prescriptions on File Prior to Visit  Medication Sig Dispense Refill  . acetaminophen (TYLENOL) 325 MG tablet Take 650 mg by mouth every 6 (six) hours as needed for mild pain or fever.    . doxycycline (VIBRAMYCIN) 100 MG capsule Take 1 capsule (100 mg total) by mouth 2 (two) times daily. 20 capsule 0  . Ibuprofen (ADVIL MIGRAINE PO) Take 2 tablets by mouth daily as needed (headache).    Marland Kitchen PROVENTIL HFA 108 (90 BASE) MCG/ACT inhaler INHALE TWO PUFFS BY MOUTH EVERY 6 HOURS AS NEEDED FOR WHEEZING 7 each 0   No current  facility-administered medications on file prior to visit.    Allergies  Allergen Reactions  . Prozac [Fluoxetine Hcl] Swelling  . Penicillins Hives, Swelling and Other (See Comments)    REACTION: Severe reaction.    Past Medical History  Diagnosis Date  . Anemia     NOS  . Depression   . Headache(784.0)   . Migraine     Past Surgical History  Procedure Laterality Date  . Vaginal hysterectomy  2002    Prolapse, unsure if she still has ovaries    Family History  Problem Relation Age of Onset  . Diabetes Mother   . Bipolar disorder Mother   . Diabetes Father   . Diabetes Daughter   . ADD / ADHD Son   . Diabetes Brother     History   Social History  . Marital Status: Married    Spouse Name: N/A  . Number of Children: N/A  . Years of Education: N/A   Occupational History  . Not on file.   Social History Main Topics  . Smoking status: Current Every Day Smoker -- 0.50 packs/day for 16 years    Types: Cigarettes  . Smokeless tobacco: Never Used     Comment: smokes  between 5-10 cigaretts a day  . Alcohol Use: No  . Drug Use: No  . Sexual Activity: Not on file   Other Topics Concern  . Not on file   Social History Narrative   Lives with husband and 4 children in [place].  Moved from [place] 3 years ago.     The PMH, PSH, Social History, Family History, Medications, and allergies have been reviewed in [facility], and have been updated if relevant.     Review of Systems  Constitutional: Positive for fever and appetite change.  Eyes: Positive for photophobia. Negative for pain, redness and visual disturbance.  Respiratory: Negative.   Cardiovascular: Negative.   Endocrine: Negative.   Genitourinary: Negative.   Musculoskeletal: Positive for myalgias, arthralgias, neck pain and neck stiffness. Negative for back pain, joint swelling and gait problem.  Skin: Negative.   Allergic/Immunologic: Negative.   Neurological: Positive for headaches. Negative for  dizziness, tremors, seizures, syncope, facial asymmetry, speech difficulty, weakness, light-headedness and numbness.  Hematological: Negative.   Psychiatric/Behavioral: Negative.   All other systems reviewed and are negative.      Objective:    BP 118/74 mmHg  Pulse 106  Temp(Src) 99.5 F (37.5 C) (Oral)  Wt 132 lb 12 oz (60.215 kg)  SpO2 99%   Physical Exam  Constitutional: She is oriented to person, place, and time. She appears well-developed and well-nourished.  Appears uncomfortable  HENT:  Head: Normocephalic.  Eyes: Conjunctivae are normal.  +photophobia  Neck: No spinous process tenderness and no muscular tenderness present. Rigidity present. Decreased range of motion present. No edema and no erythema present. Kernig's sign noted.  Cardiovascular: Normal rate.   Pulmonary/Chest: Effort normal.  Neurological: She is alert and oriented to person, place, and time. No cranial nerve deficit.  Skin: Skin is warm and dry.  Psychiatric: She has a normal mood and affect. Her behavior is normal. Judgment and thought content normal.  Nursing note and vitals reviewed.         Assessment & Plan:   Fever, unspecified fever cause No Follow-up on file.

## 2014-12-05 NOTE — Progress Notes (Signed)
Pre visit review using our clinic review tool, if applicable. No additional management support is needed unless otherwise documented below in the visit note. 

## 2014-12-05 NOTE — ED Notes (Signed)
Sent by doctor for concerned meningitis, fever headache and neck pain since Tuesday.

## 2014-12-05 NOTE — Discharge Instructions (Signed)
Fever, Adult °A fever is a higher than normal body temperature. In an adult, an oral temperature around 98.6° F (37° C) is considered normal. A temperature of 100.4° F (38° C) or higher is generally considered a fever. Mild or moderate fevers generally have no long-term effects and often do not require treatment. Extreme fever (greater than or equal to 106° F or 41.1° C) can cause seizures. The sweating that may occur with repeated or prolonged fever may cause dehydration. Elderly people can develop confusion during a fever. °A measured temperature can vary with: °· Age. °· Time of day. °· Method of measurement (mouth, underarm, rectal, or ear). °The fever is confirmed by taking a temperature with a thermometer. Temperatures can be taken different ways. Some methods are accurate and some are not. °· An oral temperature is used most commonly. Electronic thermometers are fast and accurate. °· An ear temperature will only be accurate if the thermometer is positioned as recommended by the manufacturer. °· A rectal temperature is accurate and done for those adults who have a condition where an oral temperature cannot be taken. °· An underarm (axillary) temperature is not accurate and not recommended. °Fever is a symptom, not a disease.  °CAUSES  °· Infections commonly cause fever. °· Some noninfectious causes for fever include: °· Some arthritis conditions. °· Some thyroid or adrenal gland conditions. °· Some immune system conditions. °· Some types of cancer. °· A medicine reaction. °· High doses of certain street drugs such as methamphetamine. °· Dehydration. °· Exposure to high outside or room temperatures. °· Occasionally, the source of a fever cannot be determined. This is sometimes called a "fever of unknown origin" (FUO). °· Some situations may lead to a temporary rise in body temperature that may go away on its own. Examples are: °· Childbirth. °· Surgery. °· Intense exercise. °HOME CARE INSTRUCTIONS  °· Take  appropriate medicines for fever. Follow dosing instructions carefully. If you use acetaminophen to reduce the fever, be careful to avoid taking other medicines that also contain acetaminophen. Do not take aspirin for a fever if you are younger than age 19. There is an association with Reye's syndrome. Reye's syndrome is a rare but potentially deadly disease. °· If an infection is present and antibiotics have been prescribed, take them as directed. Finish them even if you start to feel better. °· Rest as needed. °· Maintain an adequate fluid intake. To prevent dehydration during an illness with prolonged or recurrent fever, you may need to drink extra fluid. Drink enough fluids to keep your urine clear or pale yellow. °· Sponging or bathing with room temperature water may help reduce body temperature. Do not use ice water or alcohol sponge baths. °· Dress comfortably, but do not over-bundle. °SEEK MEDICAL CARE IF:  °· You are unable to keep fluids down. °· You develop vomiting or diarrhea. °· You are not feeling at least partly better after 3 days. °· You develop new symptoms or problems. °SEEK IMMEDIATE MEDICAL CARE IF:  °· You have shortness of breath or trouble breathing. °· You develop excessive weakness. °· You are dizzy or you faint. °· You are extremely thirsty or you are making little or no urine. °· You develop new pain that was not there before (such as in the head, neck, chest, back, or abdomen). °· You have persistent vomiting and diarrhea for more than 1 to 2 days. °· You develop a stiff neck or your eyes become sensitive to light. °· You develop a   skin rash.  You have a fever or persistent symptoms for more than 2 to 3 days.  You have a fever and your symptoms suddenly get worse. MAKE SURE YOU:   Understand these instructions.  Will watch your condition.  Will get help right away if you are not doing well or get worse. Document Released: 10/20/2000 Document Revised: 09/10/2013 Document  Reviewed: 02/25/2011 [facility] Patient Information 2015 [place], [place]. This information is not intended to replace advice given to you by your health care provider. Make sure you discuss any questions you have with your health care provider.  General Headache Without Cause A headache is pain or discomfort felt around the head or neck area. The specific cause of a headache may not be found. There are many causes and types of headaches. A few common ones are:  Tension headaches.  Migraine headaches.  Cluster headaches.  Chronic daily headaches. HOME CARE INSTRUCTIONS   Keep all follow-up appointments with your caregiver or any specialist referral.  Only take over-the-counter or prescription medicines for pain or discomfort as directed by your caregiver.  Lie down in a dark, quiet room when you have a headache.  Keep a headache journal to find out what may trigger your migraine headaches. For example, write down:  What you eat and drink.  How much sleep you get.  Any change to your diet or medicines.  Try massage or other relaxation techniques.  Put ice packs or heat on the head and neck. Use these 3 to 4 times per day for 15 to 20 minutes each time, or as needed.  Limit stress.  Sit up straight, and do not tense your muscles.  Quit smoking if you smoke.  Limit alcohol use.  Decrease the amount of caffeine you drink, or stop drinking caffeine.  Eat and sleep on a regular schedule.  Get 7 to 9 hours of sleep, or as recommended by your caregiver.  Keep lights dim if bright lights bother you and make your headaches worse. SEEK MEDICAL CARE IF:   You have problems with the medicines you were prescribed.  Your medicines are not working.  You have a change from the usual headache.  You have nausea or vomiting. SEEK IMMEDIATE MEDICAL CARE IF:   Your headache becomes severe.  You have a fever.  You have a stiff neck.  You have loss of vision.  You have  muscular weakness or loss of muscle control.  You start losing your balance or have trouble walking.  You feel faint or pass out.  You have severe symptoms that are different from your first symptoms. MAKE SURE YOU:   Understand these instructions.  Will watch your condition.  Will get help right away if you are not doing well or get worse. Document Released: 04/26/2005 Document Revised: 07/19/2011 Document Reviewed: 05/12/2011 [facility] Patient Information 2015 [place], [place]. This information is not intended to replace advice given to you by your health care provider. Make sure you discuss any questions you have with your health care provider.  Lumbar Puncture A lumbar puncture, or spinal tap, is a procedure in which a small amount of the fluid that surrounds the brain and spinal cord is removed and examined. The fluid is called the cerebrospinal fluid. This procedure may be done to:   Help diagnose various problems, such as meningitis, encephalitis, multiple sclerosis, and AIDS.   Remove fluid and relieve pressure that occurs with certain types of headaches.   Look for bleeding within the  brain and spinal cord areas (central nervous system).   Place medicine into the spinal fluid.  [facility] CARE PROVIDER KNOW ABOUT:  Any allergies you have.  All medicines you are taking, including vitamins, herbs, eye drops, creams, and over-the-counter medicines.  Previous problems you or members of your family have had with the use of anesthetics.  Any blood disorders you have.  Previous surgeries you have had.  Medical conditions you have. RISKS AND COMPLICATIONS Generally, this is a safe procedure. However, as with any procedure, complications can occur. Possible complications include:   Spinal headache. This is a severe headache that occurs when there is a leak of spinal fluid. A spinal headache causes discomfort but is not dangerous. If it persists, another procedure may  be done to treat the headache.  Bleeding. This most often occurs in people with bleeding disorders. These are disorders in which the blood does not clot normally.   Infection at the insertion site that can spread to the bone or spinal fluid.  Formation of a spinal cord tumor (rare).  Brain herniation or movement of the brain into the spinal cord (rare).  Inability to move (extremely rare). BEFORE THE PROCEDURE  You may have blood tests done. These tests can help tell how well your kidneys and liver are working. They can also show how well your blood clots.   If you take blood thinners (anticoagulant medicine), ask your health care provider if and when you should stop taking them.   Your health care provider may order a CT scan of your brain.  Make arrangements for someone to drive you home after the procedure.  PROCEDURE  You will be positioned so that the spaces between the bones of the spine (vertebrae) are as wide as possible. This will make it easier to pass the needle into the spinal canal.  Depending on your age and size, you may lie on your side, curled up with your knees under your chin. Or, you may sit with your head resting on a pillow that is placed at waist level.  The skin covering the lower back (or lumbar region) will be cleaned.   The skin may be numbed with medicine.  You may be given pain medicine or a medicine to help you relax (sedative).  A small needle will be inserted in the skin until it enters the space that contains the spinal fluid. The needle will not enter the spinal cord.   The spinal fluid will be collected into tubes.   The needle will be withdrawn, and a bandage will be placed on the site.  AFTER THE PROCEDURE  You will remain lying down for 1 hour or for as long as your health care provider suggests.   The spinal fluid will be sent to a laboratory to be examined. The results of the examination may be available before you go  home.  A test, called a culture, may be taken of the spinal fluid if your health care provider thinks you have an infection. If cultures were taken for exam, the results will usually be available in a couple of days.  Document Released: 04/23/2000 Document Revised: 02/14/2013 Document Reviewed: 01/01/2013 [facility] Patient Information 2015 [place], [place]. This information is not intended to replace advice given to you by your health care provider. Make sure you discuss any questions you have with your health care provider.

## 2014-12-05 NOTE — ED Provider Notes (Signed)
[facility] Emergency Department Provider Note  ____________________________________________  Time seen: 8:30 AM  I have reviewed the triage vital signs and the nursing notes.   HISTORY  Chief Complaint Headache and Fever    HPI Erika Brock is a 39 y.o. female who complains of headache and neck pain radiating down the entire back and fever for the past 3 days. She was seen in the [facility] emergency room 2 days ago where she was prescribed doxycycline and told to follow up with primary care in 2 days.  She has not yet filled the prescription records are started the doxycycline. She does not have any known tick exposure, and although she did have a small erythematous area that was concerning for insect bite on the left medial foot, she did not see anything bite her or have any interest in sex and needed to be removed.  She has some nausea and malaise and has not eaten since yesterday, but has been tolerating oral intake. The neck pain is worse with movement no change in vision. No numbness tingling or weakness or syncope. No cough or any nose or recent illness. No shortness of breath or chest pain or abdominal pain or diarrhea.  She denies taking any medications at this time except for ibuprofen.   Past Medical History  Diagnosis Date  . Anemia     NOS  . Depression   . Headache(784.0)   . Migraine     Patient Active Problem List   Diagnosis Date Noted  . Fever 12/05/2014  . Pain in joint, ankle and foot 08/12/2014  . Abnormal ultrasound of pelvis 09/24/2013  . Routine general medical examination at a health care facility 09/13/2013  . Abdominal pain, left lower quadrant 09/13/2013  . POOR CONCENTRATION 02/17/2009  . Depression with anxiety 02/17/2009  . ANEMIA, HX OF 02/17/2009    Past Surgical History  Procedure Laterality Date  . Vaginal hysterectomy  2002    Prolapse, unsure if she still has ovaries    Current Outpatient Rx  Name   Route  Sig  Dispense  Refill  . acetaminophen (TYLENOL) 325 MG tablet   Oral   Take 650 mg by mouth every 6 (six) hours as needed for mild pain or fever.         . escitalopram (LEXAPRO) 10 MG tablet   Oral   Take 15 mg by mouth daily.         . fluticasone (FLONASE) 50 MCG/ACT nasal spray   Each Nare   Place 2 sprays into both nostrils daily.         . Ibuprofen (ADVIL MIGRAINE PO)   Oral   Take 2 tablets by mouth daily as needed (headache).         . doxycycline (VIBRAMYCIN) 100 MG capsule   Oral   Take 1 capsule (100 mg total) by mouth 2 (two) times daily.   20 capsule   0   . doxycycline (VIBRAMYCIN) 100 MG capsule   Oral   Take 1 capsule (100 mg total) by mouth 2 (two) times daily.   28 capsule   0   . PROVENTIL HFA 108 (90 BASE) MCG/ACT inhaler      INHALE TWO PUFFS BY MOUTH EVERY 6 HOURS AS NEEDED FOR WHEEZING   7 each   0     Allergies Prozac and Penicillins  Family History  Problem Relation Age of Onset  . Diabetes Mother   . Bipolar  disorder Mother   . Diabetes Father   . Diabetes Daughter   . ADD / ADHD Son   . Diabetes Brother     Social History History  Substance Use Topics  . Smoking status: Current Every Day Smoker -- 0.50 packs/day for 16 years    Types: Cigarettes  . Smokeless tobacco: Never Used     Comment: smokes between 5-10 cigaretts a day  . Alcohol Use: No    Review of Systems  Constitutional: Positive fever and chills. No weight changes Eyes:No blurry vision or double vision.  ENT: No sore throat. Cardiovascular: No chest pain. Respiratory: No dyspnea or cough. Gastrointestinal: Negative for abdominal pain, vomiting and diarrhea.  No BRBPR or melena. Genitourinary: Negative for dysuria, urinary retention, bloody urine, or difficulty urinating. Musculoskeletal: Negative for back pain. No joint swelling or pain. Skin: Negative for rash. Neurological: Positive headache and neck pain and back pain. Psychiatric:No  anxiety or depression.   Endocrine:No hot/cold intolerance, changes in energy, or sleep difficulty.  10-point ROS otherwise negative.  ____________________________________________   PHYSICAL EXAM:  VITAL SIGNS: ED Triage Vitals  Enc Vitals Group     BP 12/05/14 0819 126/69 mmHg     Pulse Rate 12/05/14 0819 112     Resp 12/05/14 0819 20     Temp 12/05/14 0819 100.1 F (37.8 C)     Temp Source 12/05/14 0819 Oral     SpO2 12/05/14 0819 100 %     Weight 12/05/14 0819 132 lb (59.875 kg)     Height 12/05/14 0819 5' 1.5" (1.562 m)     Head Cir --      Peak Flow --      Pain Score 12/05/14 0816 10     Pain Loc --      Pain Edu? --      Excl. in GC? --      Constitutional: Alert and oriented. Well appearing and in no distress. Appears uncomfortable Eyes: No scleral icterus. No conjunctival pallor. PERRL. EOMI ENT   Head: Normocephalic and atraumatic. Ears normal   Nose: No congestion/rhinnorhea. No septal hematoma   Mouth/Throat: MMM, no pharyngeal erythema. No peritonsillar mass. No uvula shift.   Neck: No stridor. No SubQ emphysema. Positive stiff neck and pain with movement. She has pain up and down the spine with movement of the neck or flexion of the hip and bring him in the abdomen or chest. Hematological/Lymphatic/Immunilogical: No cervical lymphadenopathy. Cardiovascular: Tachycardia heart rate 110. Normal and symmetric distal pulses are present in all extremities. No murmurs, rubs, or gallops. Respiratory: Normal respiratory effort without tachypnea nor retractions. Breath sounds are clear and equal bilaterally. No wheezes/rales/rhonchi. Gastrointestinal: Soft and nontender. No distention. There is no CVA tenderness.  No rebound, rigidity, or guarding. Genitourinary: deferred Musculoskeletal: Nontender with normal range of motion in all extremities. No joint effusions.  No lower extremity tenderness.  No edema. Neurologic:   Normal speech and language.  CN  2-10 normal. Motor grossly intact. No pronator drift.  Normal gait. No gross focal neurologic deficits are appreciated.  Skin:  Skin is warm, dry and intact. Small 2-3 mm papillary lesion on the left medial forefoot with surrounding erythema with a diameter approximately 2 cm. The area is tender. There is no crepitus or induration or fluctuance. No bullae or petechiae.Marland Kitchen Psychiatric: Mood and affect are normal. Speech and behavior are normal. Patient exhibits appropriate insight and judgment.  ____________________________________________    LABS (pertinent positives/negatives) (all labs ordered are  listed, but only abnormal results are displayed) Labs Reviewed  BASIC METABOLIC PANEL - Abnormal; Notable for the following:    Calcium 8.4 (*)    All other components within normal limits  CBC WITH DIFFERENTIAL/PLATELET - Abnormal; Notable for the following:    WBC 3.5 (*)    Lymphs Abs 0.9 (*)    All other components within normal limits  CSF CELL COUNT WITH DIFFERENTIAL - Abnormal; Notable for the following:    Appearance, CSF CLEAR (*)    All other components within normal limits  CSF CELL COUNT WITH DIFFERENTIAL - Abnormal; Notable for the following:    Appearance, CSF CLEAR (*)    All other components within normal limits  PROTEIN, CSF - Abnormal; Notable for the following:    Total  Protein, CSF 12 (*)    All other components within normal limits  CSF CULTURE  PROTIME-INR  APTT  GLUCOSE, CSF  HERPES SIMPLEX VIRUS(HSV) DNA BY PCR   ____________________________________________   EKG    ____________________________________________    RADIOLOGY  CT head unremarkable  ____________________________________________   PROCEDURES LUMBAR PUNCTURE  Date/Time: 12/05/2014 at 10:26 AM Performed by: Scotty Court, Timica Marcom at 10:00 AM  Consent: Verbal consent obtained. Written consent obtained. Risks and benefits: risks, benefits and alternatives were discussed Consent given by:  Patient Patient understanding: patient states understanding of the procedure being performed  Patient consent: the patient's understanding of the procedure matches consent given  Procedure consent: procedure consent matches procedure scheduled  Relevant documents: relevant documents present and verified  Test results: test results available and properly labeled Site marked: the operative site was marked Imaging studies: imaging studies available , CT head reviewed Required items: required blood products, implants, devices, and special equipment available  Patient identity confirmed: verbally with patient and arm band  Time out: Immediately prior to procedure a "time out" was called to verify the correct patient, procedure, equipment, support staff and site/side marked as required.  Indications: Fever headache meningeal signs  Anesthesia: local infiltration Local anesthetic: lidocaine 1% with epinephrine Anesthetic total: 3 ml Patient sedated: no Analgesia: iv dilaudid Preparation: Patient was prepped and draped in the usual sterile fashion. Lumbar space: L3-L4 interspace Patient's position: left lateral decubitus Needle gauge: 20 Needle length: 3.5 in Number of attempts: 1 Opening pressure: 28 cm H2O Fluid appearance: clear Tubes of fluid: 4 Total volume: 9 ml Post-procedure: site cleaned and adhesive bandage applied Patient tolerance: Patient tolerated the procedure well with no immediate complications. Returned to supine position of comfort with no change in status.  ____________________________________________   INITIAL IMPRESSION / ASSESSMENT AND PLAN / ED COURSE  Pertinent labs & imaging results that were available during my care of the patient were reviewed by me and considered in my medical decision making (see chart for details).  Presentation concerning for meningitis. With the symptoms going on for 3-4 days it's unlikely to be bacterial but we will start empiric  antibiotics with ceftriaxone and vancomycin. Also start acyclovir and Decadron on the chance this could be viral meningitis. Patient is consented for a lumbar puncture will check labs and CT head and give IV fluids and IV Dilaudid and Tylenol while we prepared for the procedure. ----------------------------------------- 1:33 PM on 12/05/2014 -----------------------------------------  Vital signs stable in the ED, tachycardia resolved with IV fluids. Symptoms are controlled at this time. Workup is actually negative, with CSF cell counts and Gram stain unremarkable. Laboratory data do not suggest that this is bacterial or viral meningitis, low  suspicion for fungal meningitis. This may be related to recent insect bite on the foot where there is still a erythematous lesion, for which the patient was prescribed doxycycline but never got it filled. I encouraged the patient filled the doxycycline and continue taking it. We'll give her a dose of doxycycline here prior to discharge and write her prescription for doxycycline again to be sure that she has access to this medication. ____________________________________________   FINAL CLINICAL IMPRESSION(S) / ED DIAGNOSES  Final diagnoses:  Neck pain  Fever  Headache   systemic inflammatory response syndrome    Sharman Cheek, MD 12/05/14 1335

## 2014-12-05 NOTE — Assessment & Plan Note (Signed)
Persistent but has not started taking doxycyline.  I ma more concerned today with her meningeal signs on exam.  [facility] - spoke to triage nurse letting them know that I am sending her for further evaluation/LP. The patient indicates understanding of these issues and agrees with the plan.

## 2014-12-06 LAB — HERPES SIMPLEX VIRUS(HSV) DNA BY PCR
HSV 1 DNA: NEGATIVE
HSV 2 DNA: NEGATIVE

## 2014-12-08 LAB — CSF CULTURE

## 2014-12-08 LAB — CSF CULTURE W GRAM STAIN
Culture: NO GROWTH
Gram Stain: NONE SEEN

## 2015-05-19 IMAGING — MG MM SCREEN MAMMOGRAM BILATERAL
4 series · 4 of 4 positions shown · non-contrast
Comparison: None.

CLINICAL DATA: Screening.

EXAM:
DIGITAL SCREENING BILATERAL MAMMOGRAM WITH CAD

[R CC]
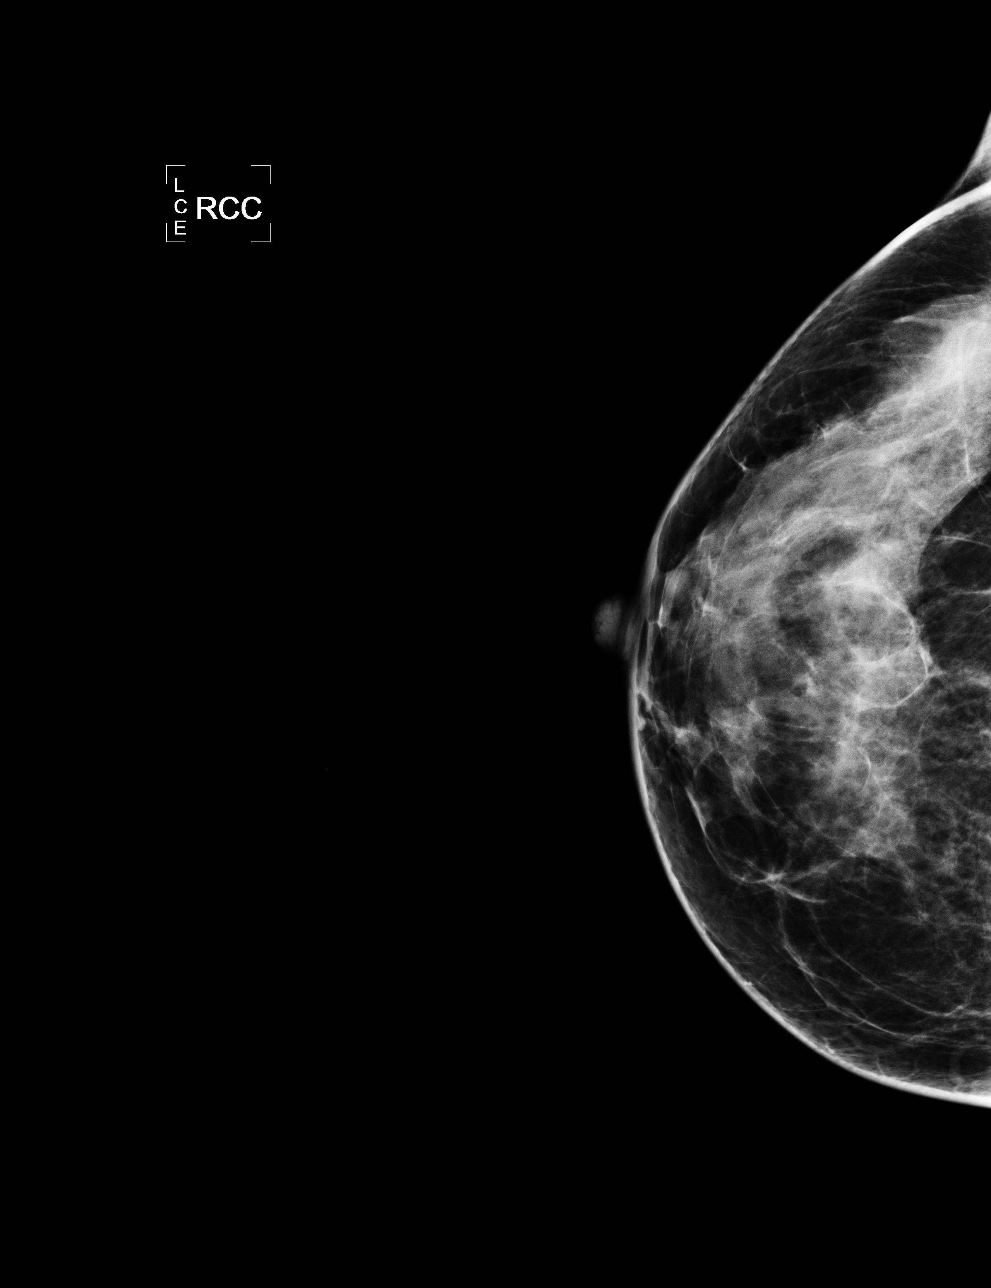

[L CC]
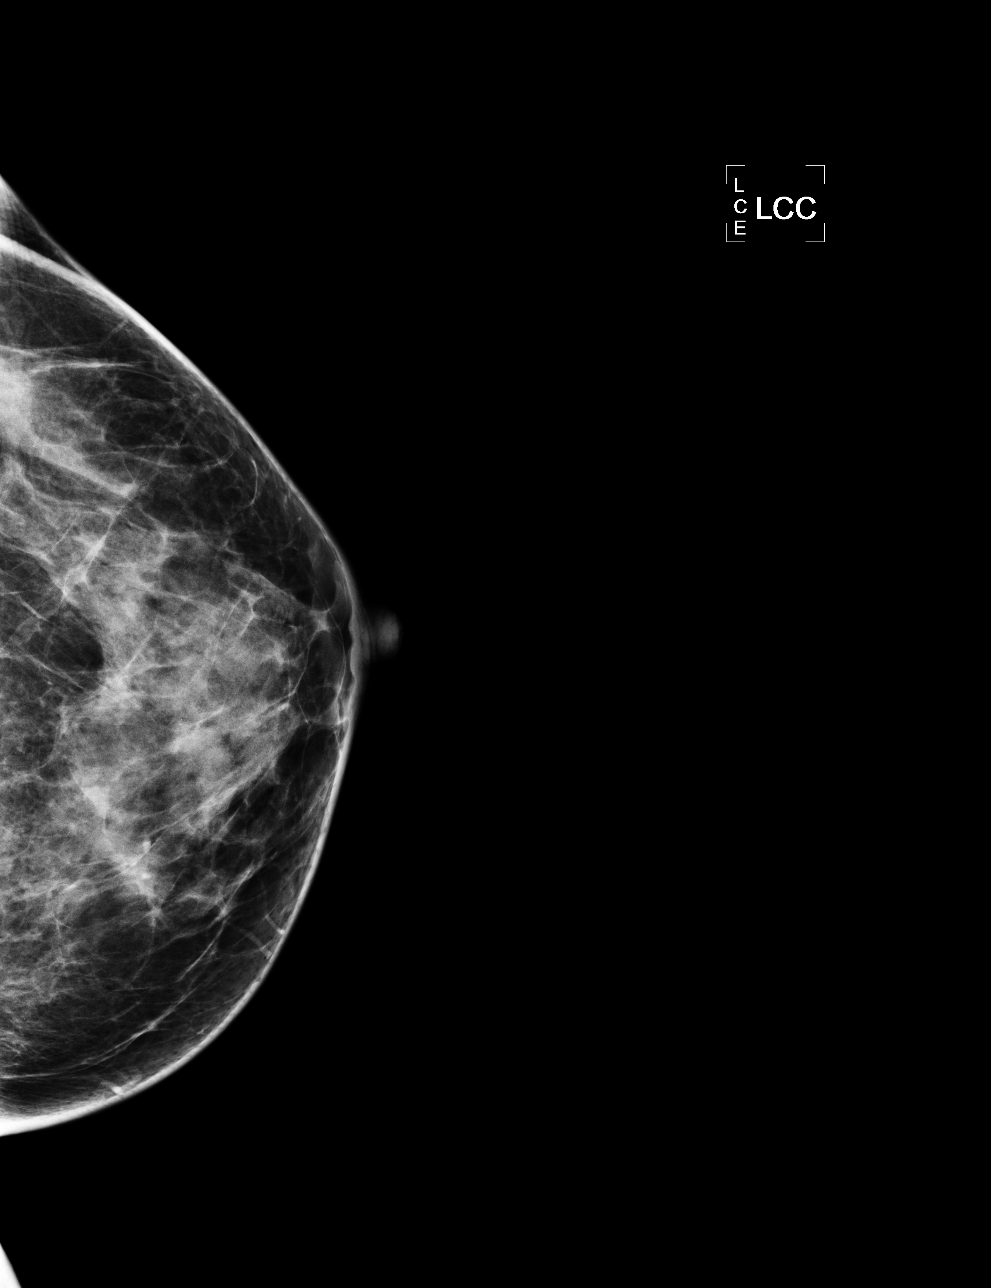

[L MLO]
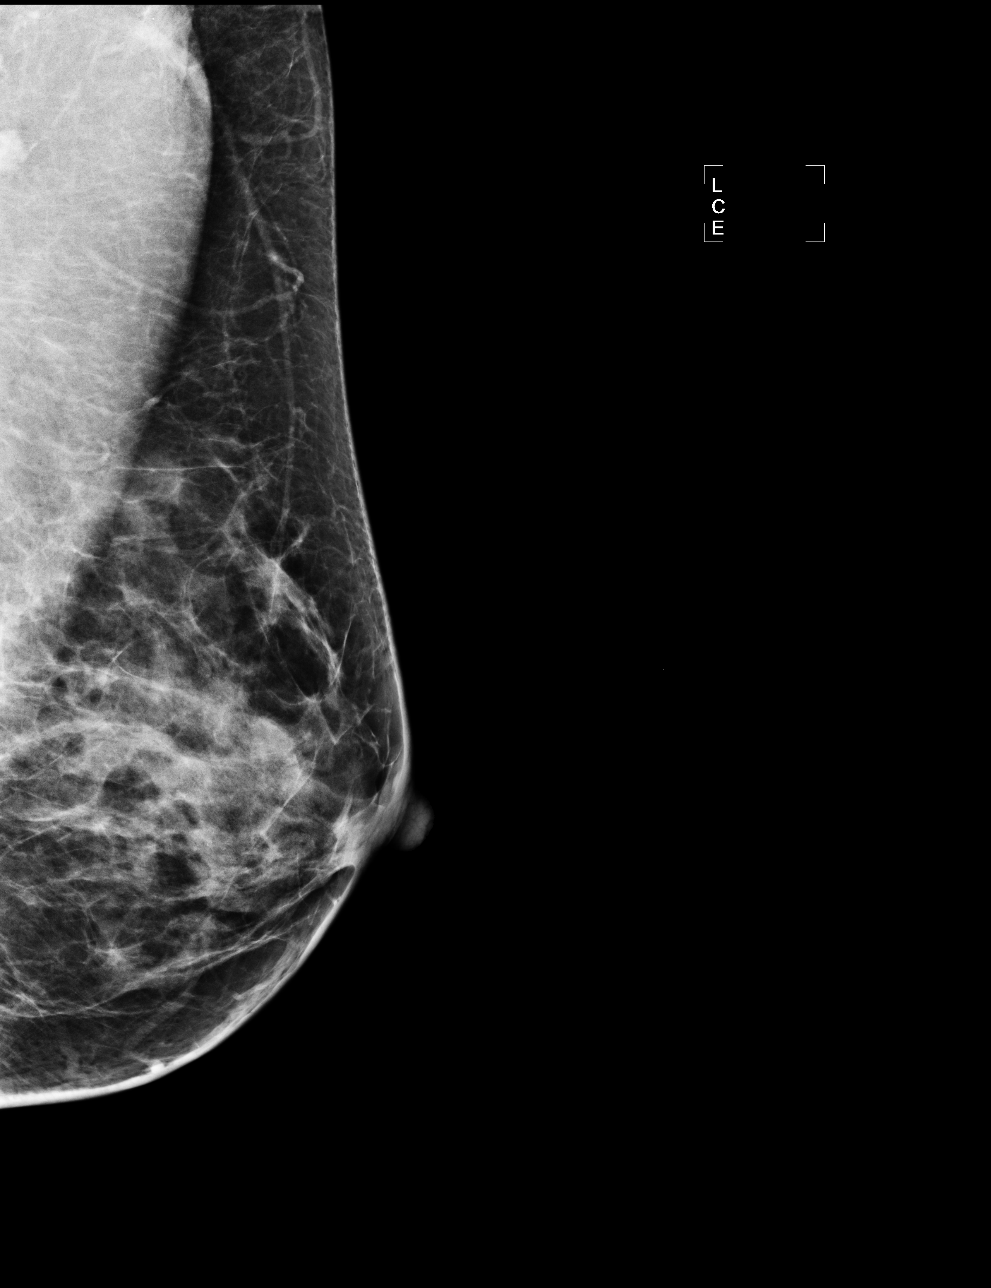

[R MLO]
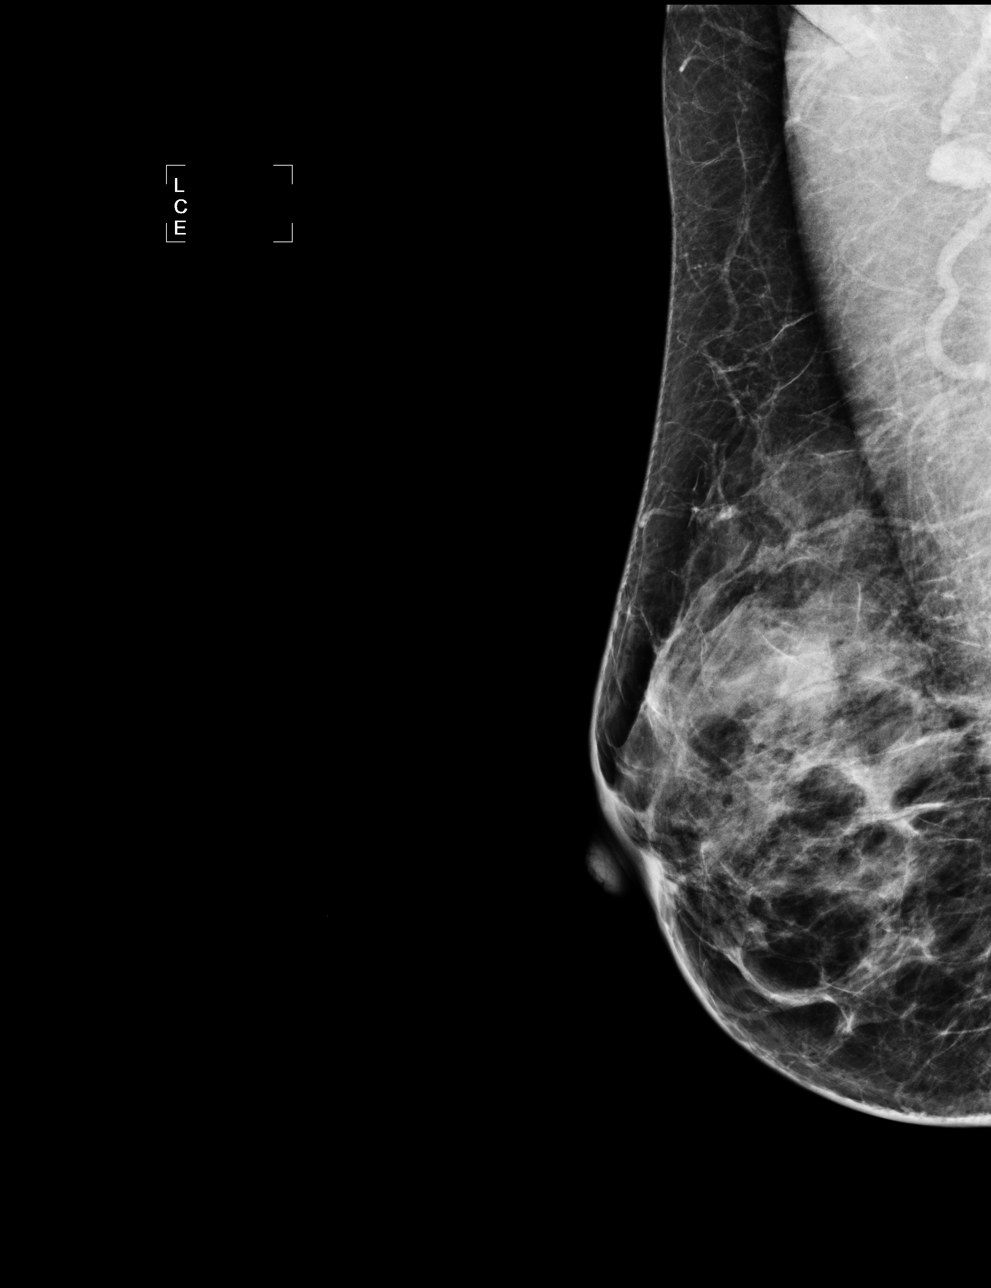

[4 of 4 positions shown; findings below may reference images not displayed]

ACR Breast Density Category c: The breast tissue is heterogeneously
dense, which may obscure small masses
FINDINGS: There are no findings suspicious for malignancy. Images were
processed with CAD.
IMPRESSION: No mammographic evidence of malignancy. A result letter of this
screening mammogram will be mailed directly to the patient.

RECOMMENDATION:
Screening mammogram in one year. (Code:U2-0-761)

BI-RADS CATEGORY  1: Negative.

## 2015-07-02 ENCOUNTER — Encounter (INDEPENDENT_AMBULATORY_CARE_PROVIDER_SITE_OTHER): Payer: Self-pay

## 2015-08-10 ENCOUNTER — Emergency Department
Admission: EM | Admit: 2015-08-10 | Discharge: 2015-08-10 | Disposition: A | Discharge status: Home / Self Care | Payer: Worker's Compensation | Attending: Student | Admitting: Student

## 2015-08-10 ENCOUNTER — Encounter: Payer: Self-pay | Admitting: Emergency Medicine

## 2015-08-10 ENCOUNTER — Emergency Department: Payer: Worker's Compensation

## 2015-08-10 DIAGNOSIS — F17210 Nicotine dependence, cigarettes, uncomplicated: Secondary | ICD-10-CM | POA: Diagnosis not present

## 2015-08-10 DIAGNOSIS — Y9389 Activity, other specified: Secondary | ICD-10-CM | POA: Diagnosis not present

## 2015-08-10 DIAGNOSIS — S9032XA Contusion of left foot, initial encounter: Secondary | ICD-10-CM

## 2015-08-10 DIAGNOSIS — S99922A Unspecified injury of left foot, initial encounter: Secondary | ICD-10-CM | POA: Diagnosis present

## 2015-08-10 DIAGNOSIS — G43909 Migraine, unspecified, not intractable, without status migrainosus: Secondary | ICD-10-CM | POA: Insufficient documentation

## 2015-08-10 DIAGNOSIS — Y929 Unspecified place or not applicable: Secondary | ICD-10-CM | POA: Diagnosis not present

## 2015-08-10 DIAGNOSIS — W208XXA Other cause of strike by thrown, projected or falling object, initial encounter: Secondary | ICD-10-CM | POA: Insufficient documentation

## 2015-08-10 DIAGNOSIS — Z79899 Other long term (current) drug therapy: Secondary | ICD-10-CM | POA: Diagnosis not present

## 2015-08-10 DIAGNOSIS — Y990 Civilian activity done for income or pay: Secondary | ICD-10-CM | POA: Insufficient documentation

## 2015-08-10 DIAGNOSIS — F329 Major depressive disorder, single episode, unspecified: Secondary | ICD-10-CM | POA: Diagnosis not present

## 2015-08-10 MED ORDER — IBUPROFEN 800 MG PO TABS
800.0000 mg | ORAL_TABLET | Freq: Once | ORAL | Status: AC
  Administered 2015-08-10: 800 mg via ORAL
  Filled 2015-08-10: qty 1

## 2015-08-10 MED ORDER — ETODOLAC 500 MG PO TABS: 500.0000 mg | ORAL_TABLET | Freq: Two times a day (BID) | ORAL | Status: DC

## 2015-08-10 NOTE — ED Notes (Signed)
Pt dropped container of sweet tea on left foot today at work.  This will be workers comp.  Ambulatory to triage.

## 2015-08-10 NOTE — ED Notes (Addendum)
First RN Presentation: Patient ambulatory to triage desk. NAD noted.

## 2015-08-10 NOTE — ED Notes (Signed)
Urine specimen collected for worker's comp and paperwork filled out. Delivered to lab at 0750

## 2015-08-10 NOTE — ED Provider Notes (Signed)
CSN: [id number]     Arrival date & time 08/10/15  0704 History   First MD Initiated Contact with Patient 08/10/15 [phone]     Chief Complaint  Patient presents with  . Foot Pain     (Consider location/radiation/quality/duration/timing/severity/associated sxs/prior Treatment) HPI  40 year old female presents to the emergency department for evaluation of left foot pain. Patient was at work earlier today, 12:30 AM when she dropped a large container of sweet tea onto her left foot. She has dorsal foot pain along the mid foot, 8 out of 10 and sharp with weightbearing. She took ibuprofen earlier this morning immediately after the injury. She denies any numbness or tingling. No other injuries throughout the body. She is ambulatory with no assisted devices.  Past Medical History  Diagnosis Date  . Anemia     NOS  . Depression   . Headache(784.0)   . Migraine    Past Surgical History  Procedure Laterality Date  . Vaginal hysterectomy  2002    Prolapse, unsure if she still has ovaries   Family History  Problem Relation Age of Onset  . Diabetes Mother   . Bipolar disorder Mother   . Diabetes Father   . Diabetes Daughter   . ADD / ADHD Son   . Diabetes Brother    Social History  Substance Use Topics  . Smoking status: Current Every Day Smoker -- 0.50 packs/day for 16 years    Types: Cigarettes  . Smokeless tobacco: Never Used     Comment: smokes between 5-10 cigaretts a day  . Alcohol Use: No   OB History    No data available     Review of Systems  Constitutional: Negative for fever, chills, activity change and fatigue.  HENT: Negative for congestion, sinus pressure and sore throat.   Eyes: Negative for visual disturbance.  Respiratory: Negative for cough, chest tightness and shortness of breath.   Cardiovascular: Negative for chest pain and leg swelling.  Gastrointestinal: Negative for nausea, vomiting, abdominal pain and diarrhea.  Genitourinary: Negative for dysuria.   Musculoskeletal: Positive for arthralgias. Negative for gait problem.  Skin: Negative for rash.  Neurological: Negative for weakness, numbness and headaches.  Hematological: Negative for adenopathy.  Psychiatric/Behavioral: Negative for behavioral problems, confusion and agitation.      Allergies  Prozac and Penicillins  Home Medications   Prior to Admission medications   Medication Sig Start Date End Date Taking? Authorizing Provider  acetaminophen (TYLENOL) 325 MG tablet Take 650 mg by mouth every 6 (six) hours as needed for mild pain or fever.    Historical Provider, MD  doxycycline (VIBRAMYCIN) 100 MG capsule Take 1 capsule (100 mg total) by mouth 2 (two) times daily. 12/04/14   Eyvonne Mechanic, PA-C  doxycycline (VIBRAMYCIN) 100 MG capsule Take 1 capsule (100 mg total) by mouth 2 (two) times daily. 12/05/14   Sharman Cheek, MD  escitalopram (LEXAPRO) 10 MG tablet Take 15 mg by mouth daily.    Historical Provider, MD  etodolac (LODINE) 500 MG tablet Take 1 tablet (500 mg total) by mouth 2 (two) times daily. 08/10/15   Evon Slack, PA-C  fluticasone (FLONASE) 50 MCG/ACT nasal spray Place 2 sprays into both nostrils daily.    Historical Provider, MD  Ibuprofen (ADVIL MIGRAINE PO) Take 2 tablets by mouth daily as needed (headache).    Historical Provider, MD  PROVENTIL HFA 108 (90 BASE) MCG/ACT inhaler INHALE TWO PUFFS BY MOUTH EVERY 6 HOURS AS NEEDED FOR WHEEZING 09/20/14  Lorre Munroeegina W Baity, NP   BP 122/72 mmHg  Pulse 93  Temp(Src) 97.7 F (36.5 C) (Oral)  Resp 16  Ht 5\' 1"  (1.549 m)  Wt 60.555 kg  BMI 25.24 kg/m2  SpO2 98% Physical Exam  Constitutional: She is oriented to person, place, and time. She appears well-developed and well-nourished. No distress.  HENT:  Head: Normocephalic and atraumatic.  Mouth/Throat: Oropharynx is clear and moist.  Eyes: EOM are normal. Pupils are equal, round, and reactive to light. Right eye exhibits no discharge. Left eye exhibits no  discharge.  Neck: Normal range of motion. Neck supple.  Cardiovascular: Normal rate and intact distal pulses.   Pulmonary/Chest: Effort normal. No respiratory distress.  Musculoskeletal:  Examination of the left lower extremity shows patient has full range of motion of the knee and ankle. There is no swelling throughout the foot or ankle. She is tender on the dorsal aspect of the left midfoot. There is no ecchymosis, swelling, warmth, erythema. No skin breakdown noted. She is nontender along the medial or lateral malleolus. Achilles tendon is intact. She has 2+ to dorsalis pedis and posterior tibialis pulses.  Neurological: She is alert and oriented to person, place, and time. She has normal reflexes.  Skin: Skin is warm and dry.  Psychiatric: She has a normal mood and affect. Her behavior is normal. Thought content normal.    ED Course  Procedures (including critical care time) SPLINT APPLICATION Date/Time: 7:57 AM Authorized by: Patience MuscaGAINES, Lyden Redner CHRISTOPHER Consent: Verbal consent obtained. Risks and benefits: risks, benefits and alternatives were discussed Consent given by: patient Splint applied by: Nurse Location details: Left foot  Splint type: Ace wrap  Supplies used: Ace wrap  Post-procedure: The splinted body part was neurovascularly unchanged following the procedure. Patient tolerance: Patient tolerated the procedure well with no immediate complications.    Labs Review Labs Reviewed - No data to display  Imaging Review Dg Foot Complete Left  08/10/2015  CLINICAL DATA:  Acute left foot pain after blunt trauma last night. Initial encounter. EXAM: LEFT FOOT - COMPLETE 3+ VIEW COMPARISON:  None. FINDINGS: There is no evidence of fracture or dislocation. There is no evidence of arthropathy or other focal bone abnormality. Soft tissues are unremarkable. IMPRESSION: Normal left foot. Electronically Signed   By: Lupita RaiderJames  Green Jr, M.D.   On: 08/10/2015 07:45   I have personally  reviewed and evaluated these images and lab results as part of my medical decision-making.   EKG Interpretation None      MDM   Final diagnoses:  Foot contusion, left, initial encounter    40 year old female with Worker's Comp. injury to the left foot. Patient dropped a large container onto the left foot. She presents weightbearing as tolerated. Examination unremarkable except for tenderness to the midfoot. X-ray showed a normal left foot with no evidence of acute bony abnormality. She is placed into an Ace wrap, educated on contusion. She is allowed to return to work activity as tolerated. Follow-up with orthopedics if no improvement in 5-7 days. Etodolac 500 mg twice a day 7-10 days. Ice 20 minutes every hour.    Evon Slackhomas C Cameryn Chrisley, PA-C 08/10/15 [id number]  Gayla DossEryka A Gayle, MD 08/10/15 [phone]

## 2015-08-10 NOTE — Discharge Instructions (Signed)
Cryotherapy °Cryotherapy means treatment with cold. Ice or gel packs can be used to reduce both pain and swelling. Ice is the most helpful within the first 24 to 48 hours after an injury or flare-up from overusing a muscle or joint. Sprains, strains, spasms, burning pain, shooting pain, and aches can all be eased with ice. Ice can also be used when recovering from surgery. Ice is effective, has very few side effects, and is safe for most people to use. °PRECAUTIONS  °Ice is not a safe treatment option for people with: °· Raynaud phenomenon. This is a condition affecting small blood vessels in the extremities. Exposure to cold may cause your problems to return. °· Cold hypersensitivity. There are many forms of cold hypersensitivity, including: °· Cold urticaria. Red, itchy hives appear on the skin when the tissues begin to warm after being iced. °· Cold erythema. This is a red, itchy rash caused by exposure to cold. °· Cold hemoglobinuria. Red blood cells break down when the tissues begin to warm after being iced. The hemoglobin that carry oxygen are passed into the urine because they cannot combine with blood proteins fast enough. °· Numbness or altered sensitivity in the area being iced. °If you have any of the following conditions, do not use ice until you have discussed cryotherapy with your caregiver: °· Heart conditions, such as arrhythmia, angina, or chronic heart disease. °· High blood pressure. °· Healing wounds or open skin in the area being iced. °· Current infections. °· Rheumatoid arthritis. °· Poor circulation. °· Diabetes. °Ice slows the blood flow in the region it is applied. This is beneficial when trying to stop inflamed tissues from spreading irritating chemicals to surrounding tissues. However, if you expose your skin to cold temperatures for too long or without the proper protection, you can damage your skin or nerves. Watch for signs of skin damage due to cold. °HOME CARE INSTRUCTIONS °Follow  these tips to use ice and cold packs safely. °· Place a dry or damp towel between the ice and skin. A damp towel will cool the skin more quickly, so you may need to shorten the time that the ice is used. °· For a more rapid response, add gentle compression to the ice. °· Ice for no more than 10 to 20 minutes at a time. The bonier the area you are icing, the less time it will take to get the benefits of ice. °· Check your skin after 5 minutes to make sure there are no signs of a poor response to cold or skin damage. °· Rest 20 minutes or more between uses. °· Once your skin is numb, you can end your treatment. You can test numbness by very lightly touching your skin. The touch should be so light that you do not see the skin dimple from the pressure of your fingertip. When using ice, most people will feel these normal sensations in this order: cold, burning, aching, and numbness. °· Do not use ice on someone who cannot communicate their responses to pain, such as small children or people with dementia. °HOW TO MAKE AN ICE PACK °Ice packs are the most common way to use ice therapy. Other methods include ice massage, ice baths, and cryosprays. Muscle creams that cause a cold, tingly feeling do not offer the same benefits that ice offers and should not be used as a substitute unless recommended by your caregiver. °To make an ice pack, do one of the following: °· Place crushed ice or a   bag of frozen vegetables in a sealable plastic bag. Squeeze out the excess air. Place this bag inside another plastic bag. Slide the bag into a pillowcase or place a damp towel between your skin and the bag. °· Mix 3 parts water with 1 part rubbing alcohol. Freeze the mixture in a sealable plastic bag. When you remove the mixture from the freezer, it will be slushy. Squeeze out the excess air. Place this bag inside another plastic bag. Slide the bag into a pillowcase or place a damp towel between your skin and the bag. °SEEK MEDICAL CARE  IF: °· You develop white spots on your skin. This may give the skin a blotchy (mottled) appearance. °· Your skin turns blue or pale. °· Your skin becomes waxy or hard. °· Your swelling gets worse. °MAKE SURE YOU:  °· Understand these instructions. °· Will watch your condition. °· Will get help right away if you are not doing well or get worse. °  °This information is not intended to replace advice given to you by your health care provider. Make sure you discuss any questions you have with your health care provider. °  °Document Released: 12/21/2010 Document Revised: 05/17/2014 Document Reviewed: 12/21/2010 °Elsevier Interactive Patient Education ©2016 [employer] ° °Foot Contusion °A foot contusion is a deep bruise to the foot. Contusions are the result of an injury that caused bleeding under the skin. The contusion may turn blue, purple, or yellow. Minor injuries will give you a painless contusion, but more severe contusions may stay painful and swollen for a few weeks. °CAUSES  °A foot contusion comes from a direct blow to that area, such as a heavy object falling on the foot. °SYMPTOMS  °· Swelling of the foot. °· Discoloration of the foot. °· Tenderness or soreness of the foot. °DIAGNOSIS  °You will have a physical exam and will be asked about your history. You may need an X-ray of your foot to look for a broken bone (fracture).  °TREATMENT  °An elastic wrap may be recommended to support your foot. Resting, elevating, and applying cold compresses to your foot are often the best treatments for a foot contusion. Over-the-counter medicines may also be recommended for pain control. °HOME CARE INSTRUCTIONS  °· Put ice on the injured area. °¨ Put ice in a plastic bag. °¨ Place a towel between your skin and the bag. °¨ Leave the ice on for 15-20 minutes, 03-04 times a day. °· Only take over-the-counter or prescription medicines for pain, discomfort, or fever as directed by your caregiver. °· If told, use an elastic  wrap as directed. This can help reduce swelling. You may remove the wrap for sleeping, showering, and bathing. If your toes become numb, cold, or blue, take the wrap off and reapply it more loosely. °· Elevate your foot with pillows to reduce swelling. °· Try to avoid standing or walking while the foot is painful. Do not resume use until instructed by your caregiver. Then, begin use gradually. If pain develops, decrease use. Gradually increase activities that do not cause discomfort until you have normal use of your foot. °· See your caregiver as directed. It is very important to keep all follow-up appointments in order to avoid any lasting problems with your foot, including long-term (chronic) pain. °SEEK IMMEDIATE MEDICAL CARE IF:  °· You have increased redness, swelling, or pain in your foot. °· Your swelling or pain is not relieved with medicines. °· You have loss of feeling in your foot   or are unable to move your toes. °· Your foot turns cold or blue. °· You have pain when you move your toes. °· Your foot becomes warm to the touch. °· Your contusion does not improve in 2 days. °MAKE SURE YOU:  °· Understand these instructions. °· Will watch your condition. °· Will get help right away if you are not doing well or get worse. °  °This information is not intended to replace advice given to you by your health care provider. Make sure you discuss any questions you have with your health care provider. °  °Document Released: 02/15/2006 Document Revised: 10/26/2011 Document Reviewed: 12/31/2014 °Elsevier Interactive Patient Education ©2016 Elsevier Inc. ° °

## 2015-08-10 NOTE — ED Notes (Signed)
NAD noted at time of D/C. Pt denies questions or concerns. Pt ambulatory to the lobby at this time. Pt refused wheelchair to the lobby.  

## 2015-10-29 ENCOUNTER — Encounter: Payer: Self-pay | Admitting: Primary Care

## 2015-10-29 ENCOUNTER — Ambulatory Visit (INDEPENDENT_AMBULATORY_CARE_PROVIDER_SITE_OTHER): Payer: 59 | Admitting: Primary Care

## 2015-10-29 VITALS — BP 120/76 | HR 80 | Temp 98.1°F | Ht 61.5 in | Wt 134.0 lb

## 2015-10-29 DIAGNOSIS — M545 Low back pain, unspecified: Secondary | ICD-10-CM

## 2015-10-29 LAB — POC URINALSYSI DIPSTICK (AUTOMATED)
Bilirubin, UA: NEGATIVE
Blood, UA: NEGATIVE
Glucose, UA: NEGATIVE
Ketones, UA: NEGATIVE
Leukocytes, UA: NEGATIVE
Nitrite, UA: NEGATIVE
Protein, UA: NEGATIVE
Spec Grav, UA: 1.01
Urobilinogen, UA: NEGATIVE
pH, UA: 6

## 2015-10-29 MED ORDER — CYCLOBENZAPRINE HCL 5 MG PO TABS: 5.0000 mg | ORAL_TABLET | Freq: Three times a day (TID) | ORAL | Status: DC | PRN

## 2015-10-29 NOTE — Progress Notes (Signed)
Pre visit review using our clinic review tool, if applicable. No additional management support is needed unless otherwise documented below in the visit note. 

## 2015-10-29 NOTE — Progress Notes (Signed)
Subjective:    Patient ID: Erika Brock, female    DOB: 03/03/1976, 40 y.o.   MRN: [id number]  HPI  Ms. Erika Brock is a 40 year old female who presents today with a chief complaint of back pain. Her pain is located to the lower thoracic spine with radiation down ward to her lumbar spine and out to bilateral lower back. Her pain has been present since waking yesterday morning.   Denies heavy lifting, radiculopathy, urinary frequency, hematuria, fevers. Sitting, movement makes it worse. Laying at an angle improves her pain. She's taken advil with temporary improvement.   Review of Systems  Constitutional: Positive for fatigue. Negative for fever.  Respiratory: Negative for cough.   Gastrointestinal: Positive for diarrhea. Negative for abdominal pain.  Genitourinary: Negative for dysuria and frequency.  Musculoskeletal: Positive for back pain.       Past Medical History  Diagnosis Date  . Anemia     NOS  . Depression   . Headache(784.0)   . Migraine      Social History   Social History  . Marital Status: Married    Spouse Name: N/A  . Number of Children: N/A  . Years of Education: N/A   Occupational History  . Not on file.   Social History Main Topics  . Smoking status: Current Every Day Smoker -- 0.50 packs/day for 16 years    Types: Cigarettes  . Smokeless tobacco: Never Used     Comment: smokes between 5-10 cigaretts a day  . Alcohol Use: No  . Drug Use: No  . Sexual Activity: Not on file   Other Topics Concern  . Not on file   Social History Narrative   Lives with husband and 4 children in [place].  Moved from [place] 3 years ago.      Past Surgical History  Procedure Laterality Date  . Vaginal hysterectomy  2002    Prolapse, unsure if she still has ovaries    Family History  Problem Relation Age of Onset  . Diabetes Mother   . Bipolar disorder Mother   . Diabetes Father   . Diabetes Daughter   . ADD / ADHD Son   . Diabetes Brother      Allergies  Allergen Reactions  . Prozac [Fluoxetine Hcl] Swelling  . Penicillins Hives, Swelling and Other (See Comments)    REACTION: Severe reaction.    Current Outpatient Prescriptions on File Prior to Visit  Medication Sig Dispense Refill  . acetaminophen (TYLENOL) 325 MG tablet Take 650 mg by mouth every 6 (six) hours as needed for mild pain or fever.    . Ibuprofen (ADVIL MIGRAINE PO) Take 2 tablets by mouth daily as needed (headache).     No current facility-administered medications on file prior to visit.    BP 120/76 mmHg  Pulse 80  Temp(Src) 98.1 F (36.7 C) (Oral)  Ht 5' 1.5" (1.562 m)  Wt 134 lb (60.782 kg)  BMI 24.91 kg/m2  SpO2 98%    Objective:   Physical Exam  Constitutional: She appears well-nourished. She does not appear ill.  Neck: Neck supple.  Cardiovascular: Normal rate and regular rhythm.   Pulmonary/Chest: Effort normal and breath sounds normal.  Abdominal: Soft. Bowel sounds are normal. There is no tenderness.  Musculoskeletal:  Tenderness upon palpation to bilateral lower thoracic and lumbar back. Pain with laying down on exam table and sitting back up. Negative straight leg raise bilaterally.  Skin: Skin is warm and dry.  Assessment & Plan:  Back pain:  Located to bilateral sides of upper thoracic in lumbar back since yesterday upon waking. No recent injury or trauma, urinary symptoms, abdominal pain. UA: Negative Exam today consistent of muscle strain/spasm to site of pain. No evidence of acute pyelonephritis, UTI. Do not suspect disc disease at this point. Will treat with conservative measures with ibuprofen, Flexeril, heat, stretches. She is to notify me if no improvement in one week.

## 2015-10-29 NOTE — Addendum Note (Signed)
Addended by: Tawnya CrookSAMBATH, Javonna Balli on: 10/29/2015 01:03 PM   Modules accepted: Orders

## 2015-10-29 NOTE — Patient Instructions (Signed)
Start Flexeril tablets for muscle spasm. Take 1 tablet by mouth up to three times daily as needed.   Continue ibuprofen. Take 600 or 800 mg three times daily as needed for pain.  Apply heat to your back three times daily for 30 minute intervals.  Please call me if no improvement or if you develop numbness/tingling in 1 week.  It was a pleasure to see you today!  Back Pain, Adult Back pain is very common in adults.The cause of back pain is rarely dangerous and the pain often gets better over time.The cause of your back pain may not be known. Some common causes of back pain include:  Strain of the muscles or ligaments supporting the spine.  Wear and tear (degeneration) of the spinal disks.  Arthritis.  Direct injury to the back. For many people, back pain may return. Since back pain is rarely dangerous, most people can learn to manage this condition on their own. HOME CARE INSTRUCTIONS Watch your back pain for any changes. The following actions may help to lessen any discomfort you are feeling:  Remain active. It is stressful on your back to sit or stand in one place for long periods of time. Do not sit, drive, or stand in one place for more than 30 minutes at a time. Take short walks on even surfaces as soon as you are able.Try to increase the length of time you walk each day.  Exercise regularly as directed by your health care provider. Exercise helps your back heal faster. It also helps avoid future injury by keeping your muscles strong and flexible.  Do not stay in bed.Resting more than 1-2 days can delay your recovery.  Pay attention to your body when you bend and lift. The most comfortable positions are those that put less stress on your recovering back. Always use proper lifting techniques, including:  Bending your knees.  Keeping the load close to your body.  Avoiding twisting.  Find a comfortable position to sleep. Use a firm mattress and lie on your side with your  knees slightly bent. If you lie on your back, put a pillow under your knees.  Avoid feeling anxious or stressed.Stress increases muscle tension and can worsen back pain.It is important to recognize when you are anxious or stressed and learn ways to manage it, such as with exercise.  Take medicines only as directed by your health care provider. Over-the-counter medicines to reduce pain and inflammation are often the most helpful.Your health care provider may prescribe muscle relaxant drugs.These medicines help dull your pain so you can more quickly return to your normal activities and healthy exercise.  Apply ice to the injured area:  Put ice in a plastic bag.  Place a towel between your skin and the bag.  Leave the ice on for 20 minutes, 2-3 times a day for the first 2-3 days. After that, ice and heat may be alternated to reduce pain and spasms.  Maintain a healthy weight. Excess weight puts extra stress on your back and makes it difficult to maintain good posture. SEEK MEDICAL CARE IF:  You have pain that is not relieved with rest or medicine.  You have increasing pain going down into the legs or buttocks.  You have pain that does not improve in one week.  You have night pain.  You lose weight.  You have a fever or chills. SEEK IMMEDIATE MEDICAL CARE IF:   You develop new bowel or bladder control problems.  You have  unusual weakness or numbness in your arms or legs.  You develop nausea or vomiting.  You develop abdominal pain.  You feel faint.   This information is not intended to replace advice given to you by your health care provider. Make sure you discuss any questions you have with your health care provider.   Document Released: 04/26/2005 Document Revised: 05/17/2014 Document Reviewed: 08/28/2013 Elsevier Interactive Patient Education [employer].

## 2016-07-08 ENCOUNTER — Encounter: Payer: Self-pay | Admitting: Emergency Medicine

## 2016-07-08 ENCOUNTER — Emergency Department
Admission: EM | Admit: 2016-07-08 | Discharge: 2016-07-08 | Disposition: A | Discharge status: Home / Self Care | Payer: No Typology Code available for payment source | Attending: Emergency Medicine | Admitting: Emergency Medicine

## 2016-07-08 DIAGNOSIS — S39012A Strain of muscle, fascia and tendon of lower back, initial encounter: Secondary | ICD-10-CM

## 2016-07-08 DIAGNOSIS — Y9389 Activity, other specified: Secondary | ICD-10-CM | POA: Diagnosis not present

## 2016-07-08 DIAGNOSIS — F17210 Nicotine dependence, cigarettes, uncomplicated: Secondary | ICD-10-CM | POA: Insufficient documentation

## 2016-07-08 DIAGNOSIS — Y999 Unspecified external cause status: Secondary | ICD-10-CM | POA: Diagnosis not present

## 2016-07-08 DIAGNOSIS — S3992XA Unspecified injury of lower back, initial encounter: Secondary | ICD-10-CM | POA: Diagnosis present

## 2016-07-08 DIAGNOSIS — Y92410 Unspecified street and highway as the place of occurrence of the external cause: Secondary | ICD-10-CM | POA: Diagnosis not present

## 2016-07-08 MED ORDER — NAPROXEN 500 MG PO TABS: 500.0000 mg | ORAL_TABLET | Freq: Two times a day (BID) | ORAL | 2 refills | Status: DC

## 2016-07-08 MED ORDER — CYCLOBENZAPRINE HCL 5 MG PO TABS: 5.0000 mg | ORAL_TABLET | Freq: Three times a day (TID) | ORAL | 0 refills | Status: DC | PRN
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
## 2016-07-12 ENCOUNTER — Ambulatory Visit: Payer: 59 | Admitting: Primary Care

## 2016-10-06 ENCOUNTER — Emergency Department
Admission: EM | Admit: 2016-10-06 | Discharge: 2016-10-06 | Disposition: A | Discharge status: Home / Self Care | Payer: 59 | Attending: Emergency Medicine | Admitting: Emergency Medicine

## 2016-10-06 ENCOUNTER — Telehealth: Payer: Self-pay | Admitting: Emergency Medicine

## 2016-10-06 DIAGNOSIS — Z5321 Procedure and treatment not carried out due to patient leaving prior to being seen by health care provider: Secondary | ICD-10-CM | POA: Insufficient documentation

## 2016-10-06 DIAGNOSIS — R109 Unspecified abdominal pain: Secondary | ICD-10-CM | POA: Insufficient documentation

## 2016-10-06 DIAGNOSIS — F17210 Nicotine dependence, cigarettes, uncomplicated: Secondary | ICD-10-CM | POA: Insufficient documentation

## 2016-10-06 LAB — CBC
HCT: 39.6 % (ref 35.0–47.0)
Hemoglobin: 14 g/dL (ref 12.0–16.0)
MCH: 30.8 pg (ref 26.0–34.0)
MCHC: 35.5 g/dL (ref 32.0–36.0)
MCV: 86.6 fL (ref 80.0–100.0)
Platelets: 274 10*3/uL (ref 150–440)
RBC: 4.57 MIL/uL (ref 3.80–5.20)
RDW: 12.9 % (ref 11.5–14.5)
WBC: 9.4 10*3/uL (ref 3.6–11.0)

## 2016-10-06 LAB — COMPREHENSIVE METABOLIC PANEL
ALT: 13 U/L — ABNORMAL LOW (ref 14–54)
AST: 17 U/L (ref 15–41)
Albumin: 4.5 g/dL (ref 3.5–5.0)
Alkaline Phosphatase: 64 U/L (ref 38–126)
Anion gap: 8 (ref 5–15)
BUN: 8 mg/dL (ref 6–20)
CO2: 26 mmol/L (ref 22–32)
Calcium: 9.4 mg/dL (ref 8.9–10.3)
Chloride: 105 mmol/L (ref 101–111)
Creatinine, Ser: 0.9 mg/dL (ref 0.44–1.00)
GFR calc Af Amer: >60 mL/min (ref >60)
GFR calc non Af Amer: >60 mL/min (ref >60)
Glucose, Bld: 82 mg/dL (ref 65–99)
Potassium: 3.5 mmol/L (ref 3.5–5.1)
Sodium: 139 mmol/L (ref 135–145)
Total Bilirubin: 0.6 mg/dL (ref 0.3–1.2)
Total Protein: 7.5 g/dL (ref 6.5–8.1)

## 2016-10-06 LAB — TROPONIN I: Troponin I: <0.03 ng/mL (ref <0.03)

## 2016-10-06 LAB — LIPASE, BLOOD: Lipase: 31 U/L (ref 11–51)

## 2016-10-06 NOTE — ED Triage Notes (Signed)
Pt states she woke up this am feeling shaky, dizzy, and having mid abd pain. States vomited x 2 today no diarrhea.

## 2016-10-06 NOTE — Telephone Encounter (Signed)
Called patient due to lwot to inquire about condition and follow up plans. pateint says still has sx.  Has not called her pcp yet as she has to work.  I asked her to at least call the pcp and have them look at labs.  I also informed her that even if labs are okay, the exam is very important.  I also told her she could always return here.  She agrees to call.

## 2017-01-24 ENCOUNTER — Ambulatory Visit (INDEPENDENT_AMBULATORY_CARE_PROVIDER_SITE_OTHER): Payer: Self-pay | Admitting: Family Medicine

## 2017-01-24 ENCOUNTER — Encounter: Payer: Self-pay | Admitting: Family Medicine

## 2017-01-24 ENCOUNTER — Encounter (INDEPENDENT_AMBULATORY_CARE_PROVIDER_SITE_OTHER): Payer: Self-pay

## 2017-01-24 VITALS — BP 110/78 | HR 88 | Temp 98.4°F | Wt 117.2 lb

## 2017-01-24 DIAGNOSIS — G4700 Insomnia, unspecified: Secondary | ICD-10-CM

## 2017-01-24 DIAGNOSIS — F4310 Post-traumatic stress disorder, unspecified: Secondary | ICD-10-CM | POA: Insufficient documentation

## 2017-01-24 DIAGNOSIS — F418 Other specified anxiety disorders: Secondary | ICD-10-CM

## 2017-01-24 MED ORDER — ESCITALOPRAM OXALATE 20 MG PO TABS: 20.0000 mg | ORAL_TABLET | Freq: Every day | ORAL | 3 refills | Status: DC

## 2017-01-24 MED ORDER — ZOLPIDEM TARTRATE 5 MG PO TABS: 5.0000 mg | ORAL_TABLET | Freq: Every evening | ORAL | 0 refills | Status: DC | PRN

## 2017-01-24 NOTE — Patient Instructions (Signed)
Great to see you. I am so sorry about what you've been through.  Please keep your appointment with your therapist.  We are starting lexapro 20 mg daily- okay to cut in half- 10 mg daily for the first 10 days and then increase to full tablet to minimize side effects.

## 2017-01-24 NOTE — Assessment & Plan Note (Signed)
New-  >25 minutes spent in face to face time with patient, >50% spent in counselling or coordination of care PHQ 9 of 18. She will keep her first appointment with therapist tomorrow. rx printed for ambien and given to pt- discussed habit forming nature.  She is aware. eRx sent for lexapro 20 mg daily- she has tolerated this well in past. Call or return to clinic prn if these symptoms worsen or fail to improve as anticipated. The patient indicates understanding of these issues and agrees with the plan.
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
## 2017-05-21 ENCOUNTER — Encounter: Payer: Self-pay | Admitting: Emergency Medicine

## 2017-05-21 ENCOUNTER — Other Ambulatory Visit: Payer: Self-pay

## 2017-05-21 ENCOUNTER — Emergency Department
Admission: EM | Admit: 2017-05-21 | Discharge: 2017-05-21 | Disposition: A | Discharge status: Home / Self Care | Payer: Self-pay | Attending: Emergency Medicine | Admitting: Emergency Medicine

## 2017-05-21 DIAGNOSIS — R197 Diarrhea, unspecified: Secondary | ICD-10-CM | POA: Insufficient documentation

## 2017-05-21 DIAGNOSIS — Z79899 Other long term (current) drug therapy: Secondary | ICD-10-CM | POA: Insufficient documentation

## 2017-05-21 DIAGNOSIS — F17210 Nicotine dependence, cigarettes, uncomplicated: Secondary | ICD-10-CM | POA: Insufficient documentation

## 2017-05-21 DIAGNOSIS — R1084 Generalized abdominal pain: Secondary | ICD-10-CM | POA: Insufficient documentation

## 2017-05-21 LAB — COMPREHENSIVE METABOLIC PANEL
ALT: 15 U/L (ref 14–54)
AST: 21 U/L (ref 15–41)
Albumin: 3.8 g/dL (ref 3.5–5.0)
Alkaline Phosphatase: 75 U/L (ref 38–126)
Anion gap: 6 (ref 5–15)
BUN: 11 mg/dL (ref 6–20)
CO2: 24 mmol/L (ref 22–32)
Calcium: 8.5 mg/dL — ABNORMAL LOW (ref 8.9–10.3)
Chloride: 109 mmol/L (ref 101–111)
Creatinine, Ser: 0.81 mg/dL (ref 0.44–1.00)
GFR calc Af Amer: >60 mL/min (ref >60)
GFR calc non Af Amer: >60 mL/min (ref >60)
Glucose, Bld: 100 mg/dL — ABNORMAL HIGH (ref 65–99)
Potassium: 3.7 mmol/L (ref 3.5–5.1)
Sodium: 139 mmol/L (ref 135–145)
Total Bilirubin: 0.4 mg/dL (ref 0.3–1.2)
Total Protein: 6.9 g/dL (ref 6.5–8.1)

## 2017-05-21 LAB — CBC
HCT: 41.2 % (ref 35.0–47.0)
Hemoglobin: 14.1 g/dL (ref 12.0–16.0)
MCH: 30.6 pg (ref 26.0–34.0)
MCHC: 34.2 g/dL (ref 32.0–36.0)
MCV: 89.4 fL (ref 80.0–100.0)
Platelets: 229 10*3/uL (ref 150–440)
RBC: 4.61 MIL/uL (ref 3.80–5.20)
RDW: 12.6 % (ref 11.5–14.5)
WBC: 7.4 10*3/uL (ref 3.6–11.0)

## 2017-05-21 LAB — URINALYSIS, COMPLETE (UACMP) WITH MICROSCOPIC
Bacteria, UA: NONE SEEN
Bilirubin Urine: NEGATIVE
Glucose, UA: NEGATIVE mg/dL
Hgb urine dipstick: NEGATIVE
Ketones, ur: 5 mg/dL — AB
Leukocytes, UA: NEGATIVE
Nitrite: NEGATIVE
Protein, ur: NEGATIVE mg/dL
Specific Gravity, Urine: 1.03 (ref 1.005–1.030)
Squamous Epithelial / LPF: NONE SEEN
pH: 5 (ref 5.0–8.0)

## 2017-05-21 LAB — LIPASE, BLOOD: Lipase: 30 U/L (ref 11–51)

## 2017-05-21 MED ORDER — LOPERAMIDE HCL 2 MG PO TABS: 4.0000 mg | ORAL_TABLET | Freq: Four times a day (QID) | ORAL | 0 refills | Status: DC | PRN

## 2017-05-21 MED ORDER — FAMOTIDINE 20 MG PO TABS
20.0000 mg | ORAL_TABLET | Freq: Once | ORAL | Status: AC
  Administered 2017-05-21: 20 mg via ORAL

## 2017-05-21 MED ORDER — FAMOTIDINE 20 MG PO TABS
ORAL_TABLET | ORAL | Status: AC
  Filled 2017-05-21: qty 1

## 2017-05-21 MED ORDER — FAMOTIDINE 20 MG PO TABS: 20.0000 mg | ORAL_TABLET | Freq: Two times a day (BID) | ORAL | 0 refills | Status: DC

## 2017-05-21 MED ORDER — GI COCKTAIL ~~LOC~~
30.0000 mL | Freq: Once | ORAL | Status: AC
  Administered 2017-05-21: 30 mL via ORAL
  Filled 2017-05-21: qty 30

## 2017-05-21 NOTE — ED Provider Notes (Signed)
[facility] Emergency Department Provider Note  ____________________________________________  Time seen: Approximately 4:21 PM  I have reviewed the triage vital signs and the nursing notes.   HISTORY  Chief Complaint Diarrhea    HPI Erika Brock is a 42 y.o. female who complains of diarrhea for the past 5 days. Watery brown. Not black or bloody. Associated with generalized abdominal pain which is nonradiating, no aggravating or alleviating factors, moderate intensity and crampy. It is waxing and waning. No trauma, no fever chills sweats or vomiting. Tolerating oral intake just fine, had 2 [place] dogs today.     Past Medical History:  Diagnosis Date  . Anemia    NOS  . Depression   . Headache(784.0)   . Migraine      Patient Active Problem List   Diagnosis Date Noted  . PTSD (post-traumatic stress disorder) 01/24/2017  . Fever 12/05/2014  . Abnormal ultrasound of pelvis 09/24/2013  . Abdominal pain, left lower quadrant 09/13/2013  . POOR CONCENTRATION 02/17/2009  . Depression with anxiety 02/17/2009  . ANEMIA, HX OF 02/17/2009     Past Surgical History:  Procedure Laterality Date  . VAGINAL HYSTERECTOMY  2002   Prolapse, unsure if she still has ovaries     Prior to Admission medications   Medication Sig Start Date End Date Taking? Authorizing Provider  acetaminophen (TYLENOL) 325 MG tablet Take 650 mg by mouth every 6 (six) hours as needed for mild pain or fever.    [provider]  escitalopram (LEXAPRO) 20 MG tablet Take 1 tablet (20 mg total) by mouth daily. 01/24/17   Dianne Dun, MD  famotidine (PEPCID) 20 MG tablet Take 1 tablet (20 mg total) by mouth 2 (two) times daily. 05/21/17   Sharman Cheek, MD  Ibuprofen (ADVIL MIGRAINE PO) Take 2 tablets by mouth daily as needed (headache).    [provider]  loperamide (IMODIUM A-D) 2 MG tablet Take 2 tablets (4 mg total) by mouth 4 (four) times daily as needed  for diarrhea or loose stools. 05/21/17   Sharman Cheek, MD  Multiple Vitamin (MULTIVITAMIN) tablet Take 1 tablet by mouth daily.    [provider]  zolpidem (AMBIEN) 5 MG tablet Take 1 tablet (5 mg total) by mouth at bedtime as needed for sleep. 01/24/17   Dianne Dun, MD     Allergies Prozac [fluoxetine hcl] and Penicillins   Family History  Problem Relation Age of Onset  . Diabetes Mother   . Bipolar disorder Mother   . Diabetes Father   . Diabetes Daughter   . ADD / ADHD Son   . Diabetes Brother     Social History Social History   Tobacco Use  . Smoking status: Current Every Day Smoker    Packs/day: 0.25    Years: 16.00    Pack years: 4.00    Types: Cigarettes  . Smokeless tobacco: Never Used  . Tobacco comment: smokes between 5-10 cigaretts a day  Substance Use Topics  . Alcohol use: No  . Drug use: No    Review of Systems  Constitutional:   No fever or chills.  ENT:   Positive sore throat. No rhinorrhea. Cardiovascular:   No chest pain or syncope. Respiratory:   No dyspnea or cough. Gastrointestinal:   Positive as above for abdominal pain and diarrhea. No nausea or vomiting  Musculoskeletal:   Negative for focal pain or swelling All other systems reviewed and are negative except as documented above  in ROS and HPI.  ____________________________________________   PHYSICAL EXAM:  VITAL SIGNS: ED Triage Vitals  Enc Vitals Group     BP 05/21/17 1325 113/66     Pulse Rate 05/21/17 1325 91     Resp 05/21/17 1325 16     Temp 05/21/17 1325 97.6 F (36.4 C)     Temp Source 05/21/17 1325 Oral     SpO2 05/21/17 1325 100 %     Weight 05/21/17 1322 125 lb (56.7 kg)     Height 05/21/17 1322 5\' 1"  (1.549 m)     Head Circumference --      Peak Flow --      Pain Score 05/21/17 1321 3     Pain Loc --      Pain Edu? --      Excl. in GC? --     Vital signs reviewed, nursing assessments reviewed.   Constitutional:   Alert and oriented. Well  appearing and in no distress. Eyes:   No scleral icterus.  EOMI. No nystagmus. No conjunctival pallor. PERRL. ENT   Head:   Normocephalic and atraumatic.   Nose:   Positive nasal congestion.    Mouth/Throat:   MMM, positive pharyngeal erythema. No peritonsillar mass.    Neck:   No meningismus. Full ROM. Hematological/Lymphatic/Immunilogical:   No cervical lymphadenopathy. Cardiovascular:   RRR. Symmetric bilateral radial and DP pulses.  No murmurs.  Respiratory:   Normal respiratory effort without tachypnea/retractions. Breath sounds are clear and equal bilaterally. No wheezes/rales/rhonchi. Gastrointestinal:   Soft with mild epigastric and left lower quadrant tenderness. Non distended. There is no CVA tenderness.  No rebound, rigidity, or guarding. Genitourinary:   deferred Musculoskeletal:   Normal range of motion in all extremities. No joint effusions.  No lower extremity tenderness.  No edema. Neurologic:   Normal speech and language.  Motor grossly intact. No gross focal neurologic deficits are appreciated.  Skin:    Skin is warm, dry and intact. No rash noted.  No petechiae, purpura, or bullae.  ____________________________________________    LABS (pertinent positives/negatives) (all labs ordered are listed, but only abnormal results are displayed) Labs Reviewed  COMPREHENSIVE METABOLIC PANEL - Abnormal; Notable for the following components:      Result Value   Glucose, Bld 100 (*)    Calcium 8.5 (*)    All other components within normal limits  URINALYSIS, COMPLETE (UACMP) WITH MICROSCOPIC - Abnormal; Notable for the following components:   Color, Urine AMBER (*)    APPearance HAZY (*)    Ketones, ur 5 (*)    All other components within normal limits  LIPASE, BLOOD  CBC   ____________________________________________   EKG    ____________________________________________    RADIOLOGY  No results  found.  ____________________________________________   PROCEDURES Procedures  ____________________________________________    CLINICAL IMPRESSION / ASSESSMENT AND PLAN / ED COURSE  Pertinent labs & imaging results that were available during my care of the patient were reviewed by me and considered in my medical decision making (see chart for details).   Patient well-appearing no acute distress, presents with normal vital signs and diarrhea for the past 5 days. Tolerating oral intake, exam is generally reassuring and benign.Considering the patient's symptoms, medical history, and physical examination today, I have low suspicion for cholecystitis or biliary pathology, pancreatitis, perforation or bowel obstruction, hernia, intra-abdominal abscess, AAA or dissection, volvulus or intussusception, mesenteric ischemia, or appendicitis.  Most likely a viral syndrome. Loperamide and famotidine for symptom relief,  follow-up with primary care. Unlikely bacterial infection due to the absence of fever or other constitutional symptoms. Doubt neoplasm with the absence of weight loss and totally normal labs. Plan for outpatient follow-up with primary care. Work note provided.      ____________________________________________   FINAL CLINICAL IMPRESSION(S) / ED DIAGNOSES    Final diagnoses:  Diarrhea of presumed infectious origin  Generalized abdominal pain       Portions of this note were generated with dragon dictation software. Dictation errors may occur despite best attempts at proofreading.    Sharman Cheek, MD 05/21/17 1626

## 2017-05-21 NOTE — ED Triage Notes (Signed)
Pt here for diarrhea for 4-5 days. No vomiting.  Pain to upper abdomen.  No recent abx.  Also c/o lower back pain.  Ambulatory.  NAD. VSS.  Skin color WNL

## 2017-05-21 NOTE — ED Notes (Signed)
Patient given meal tray.

## 2017-05-23 ENCOUNTER — Telehealth: Payer: Self-pay

## 2017-05-23 NOTE — Telephone Encounter (Signed)
I agree with your recommendations.  Thank you.

## 2017-05-23 NOTE — Telephone Encounter (Signed)
PLEASE NOTE: All timestamps contained within this report are represented as [place] Standard Time. CONFIDENTIALTY NOTICE: This fax transmission is intended only for the addressee. It contains information that is legally privileged, confidential or otherwise protected from use or disclosure. If you are not the intended recipient, you are strictly prohibited from reviewing, disclosing, copying using or disseminating any of this information or taking any action in reliance on or regarding this information. If you have received this fax in error, please notify us immediately by telephone so that we can arrange for its return to us. Phone: [phone], Toll-Free: [phone], Fax: [phone] Page: 1 of 2 Call Id: [id number] [facility] Night - Client TELEPHONE ADVICE RECORD [facility] Patient Name: Erika Brock Gender: Female DOB: 09/14/1975 Age: 89 Y 5 M 22 D Return Phone Number: [phone] (Primary), [phone] (Secondary) Address: City/State/Zip: [place] [id number] Client [facility] - Client Client Site [facility] - Night Physician Ruthe MannanAron, Talia- MD Contact Type Call Who Is Calling Patient / Member / Family / Caregiver Call Type Triage / Clinical Relationship To Patient Self Return Phone Number [phone] (Primary) Chief Complaint Diarrhea Reason for Call Symptomatic / Request for Health Information Initial Comment Caller states, has had liquid diarrhea for five days, sharp pains in stomach when eating. Weight loss. No fever. Translation No Nurse Assessment Nurse: Tera Materowan, RN, Elnita Maxwellheryl Date/Time (Eastern Time): 05/21/2017 12:33:51 PM Confirm and document reason for call. If symptomatic, describe symptoms. ---Caller states that she has had nausea with watery diarrhea for the last 5 days. States that she has a bm every time she eats or drinks anything. She is having approx 5-10 stools a day.  Pt is voiding but it is very dark in color. She also reports intermittent abd pain with eating or drinking. Does the patient have any new or worsening symptoms? ---Yes Will a triage be completed? ---Yes Related visit to physician within the last 2 weeks? ---No Does the PT have any chronic conditions? (i.e. diabetes, asthma, etc.) ---Yes List chronic conditions. ---depression Is the patient pregnant or possibly pregnant? (Ask all females between the ages of 5412-55) ---No Is this a behavioral health or substance abuse call? ---No Guidelines Guideline Title Affirmed Question Affirmed Notes Nurse Date/Time (Eastern Time) Diarrhea [1] SEVERE diarrhea (e.g., 7 or more times / day more than normal) AND [2] present > 24 hours (1 day) Tera Materowan, RN, Elnita MaxwellCheryl 05/21/2017 12:36:06 PM Disp. Time [name](Eastern Time) Disposition Final User PLEASE NOTE: All timestamps contained within this report are represented as [place] Standard Time. CONFIDENTIALTY NOTICE: This fax transmission is intended only for the addressee. It contains information that is legally privileged, confidential or otherwise protected from use or disclosure. If you are not the intended recipient, you are strictly prohibited from reviewing, disclosing, copying using or disseminating any of this information or taking any action in reliance on or regarding this information. If you have received this fax in error, please notify us immediately by telephone so that we can arrange for its return to us. Phone: [phone], Toll-Free: [phone], Fax: [phone] Page: 2 of 2 Call Id: [id number] 05/21/2017 12:40:38 PM See Physician within 24 Hours Yes Tera Materowan, RN, Elizabeth Sauerheryl Caller Disagree/Comply Comply Caller Understands Yes PreDisposition Did not know what to do Care Advice Given Per Guideline SEE PHYSICIAN WITHIN 24 HOURS: * Drink more fluids, at least 8-10 cups daily. One cup equals 8 oz (240 ml). FLUID THERAPY DURING SEVERE DIARRHEA: * SPORTS  DRINKS:  You can also drink a sports drinks (e.g., Gatorade, Powerade) to help treat and prevent dehydration. For it to work best, mix it half and half with water. * WATER: Even for severe diarrhea, water is often the best liquid to drink. You should also eat some salty foods (e.g., potato chips, pretzels, saltine crackers). This is important to make sure you are getting enough salt, sugars, and fluids to meet your body's needs. * Avoid caffeinated beverages (Reason: caffeine is mildly dehydrating). * As the diarrhea starts to get better, you can slowly return to a normal diet. FOOD AND NUTRITION DURING SEVERE DIARRHEA: * Other foods that are OK include: bananas, yogurt, crackers, soup. * Begin with boiled starches / cereals (e.g., potatoes, rice, noodles, wheat, oats) with a small amount of salt to taste. * Helps reduce diarrhea. OTC MEDS - Loperamide (Imodium AD): * Adult dosage: 4 mg (2 capsules or 4 teaspoons or 20 ml) is the recommended first dose. You may take an additional 2 mg (1 capsule or 2 teaspoons or 10 ml) after each loose BM. * Bloody stools CALL BACK IF: * Constant or severe abdominal pain * You become worse. CARE ADVICE given per Diarrhea (Adult) guideline. Referrals GO TO FACILITY UNDECIDED

## 2017-05-23 NOTE — Telephone Encounter (Signed)
Per chart review pt was [facility] on 05/21/17.

## 2017-05-23 NOTE — Telephone Encounter (Signed)
TA-Plz see phone note/pt went to [facility]12[facility]19 for diarrhea/called today as Sx have worsened/I called main number and no-answer/called home number and LMOVM that if her Sx have worsened that she needs to go back and be seen [facility] as without a visit or speaking with her she cannot be assessed properly and she is likely dehydrated/plz advise if you would like to do anything or have any other advise to give pt/thx dmf

## 2017-05-24 ENCOUNTER — Ambulatory Visit: Payer: Self-pay | Admitting: Primary Care

## 2017-05-26 ENCOUNTER — Encounter: Payer: Self-pay | Admitting: Primary Care

## 2017-05-26 ENCOUNTER — Ambulatory Visit (INDEPENDENT_AMBULATORY_CARE_PROVIDER_SITE_OTHER): Payer: BLUE CROSS/BLUE SHIELD | Admitting: Primary Care

## 2017-05-26 VITALS — BP 116/74 | HR 81 | Temp 98.1°F | Ht 61.0 in | Wt 120.2 lb

## 2017-05-26 DIAGNOSIS — K219 Gastro-esophageal reflux disease without esophagitis: Secondary | ICD-10-CM | POA: Diagnosis not present

## 2017-05-26 MED ORDER — FAMOTIDINE 20 MG PO TABS: 20.0000 mg | ORAL_TABLET | Freq: Two times a day (BID) | ORAL | 0 refills | Status: DC

## 2017-05-26 NOTE — Patient Instructions (Signed)
Nasal Congestion/Ear Pressure: Try using Flonase (fluticasone) nasal spray. Instill 1 spray in each nostril twice daily.   For runny nose, drainage down the throat, watery/itchy eyes try Claritin, Zyrtec, or Allegra.   Please notify me if your diarrhea returns.  It was a pleasure to see you today!

## 2017-05-26 NOTE — Progress Notes (Signed)
Subjective:    Patient ID: Erika Brock, female    DOB: 09-Dec-1975, 42 y.o.   MRN: [id number]  HPI  Erika Brock is a 42 year old female who presents today for emergency department follow up.  [facility] ED on 05/21/17 with a chief complaint of a five day history of diarrhea. Her stools were watery and brown with abdominal pain. She was tolerating oral food and beverage.   During her emergency department stay she underwent labs including lipase, cbc, cmp, urinalysis of which were all unremarkable. Her exam was grossly unremarkable. Her symptoms were presumed to be viral in nature. She was discharged home later that day with prescriptions for Lomotil and Pepcid.  Since her ED visit her diarrhea has subsided. She's had no diarrhea within the last 4 days. She denies nausea and vomiting. She's continued to take the Pepcid as it's helping with reflux.  She's mostly concerned about nasal congestion, sore throat. She's expelling thick green/yellow mucous from her nasal cavity. Her symptoms have been present for the past 2-3 days.  She denies cough, fevers. She's not taken anything OTC for her symptoms.   Review of Systems  Constitutional: Negative for fever.  HENT: Positive for congestion and sore throat. Negative for ear pain and sinus pressure.   Respiratory: Negative for cough and shortness of breath.   Cardiovascular: Negative for chest pain.  Gastrointestinal: Negative for abdominal pain, diarrhea, nausea and vomiting.       Past Medical History:  Diagnosis Date  . Anemia    NOS  . Depression   . Headache(784.0)   . Migraine      Social History   Socioeconomic History  . Marital status: Married    Spouse name: Not on file  . Number of children: Not on file  . Years of education: Not on file  . Highest education level: Not on file  Social Needs  . Financial resource strain: Not on file  . Food insecurity - worry: Not on file  . Food insecurity - inability:  Not on file  . Transportation needs - medical: Not on file  . Transportation needs - non-medical: Not on file  Occupational History  . Not on file  Tobacco Use  . Smoking status: Current Every Day Smoker    Packs/day: 0.25    Years: 16.00    Pack years: 4.00    Types: Cigarettes  . Smokeless tobacco: Never Used  . Tobacco comment: smokes between 5-10 cigaretts a day  Substance and Sexual Activity  . Alcohol use: No  . Drug use: No  . Sexual activity: Not on file  Other Topics Concern  . Not on file  Social History Narrative   Lives [place] 4 children in [place].  [place] 3 [place].      Past Surgical History:  Procedure Laterality Date  . VAGINAL HYSTERECTOMY  2002   Prolapse, unsure if she still has ovaries    Family History  Problem Relation Age of Onset  . Diabetes Mother   . Bipolar disorder Mother   . Diabetes Father   . Diabetes Daughter   . ADD / ADHD Son   . Diabetes Brother     Allergies  Allergen Reactions  . Prozac [Fluoxetine Hcl] Swelling  . Penicillins Hives, Swelling and Other (See Comments)    REACTION: Severe reaction.    Current Outpatient Medications on File Prior to Visit  Medication Sig Dispense Refill  .  acetaminophen (TYLENOL) 325 MG tablet Take 650 mg by mouth every 6 (six) hours as needed for mild pain or fever.    . escitalopram (LEXAPRO) 20 MG tablet Take 1 tablet (20 mg total) by mouth daily. 30 tablet 3  . Ibuprofen (ADVIL MIGRAINE PO) Take 2 tablets by mouth daily as needed (headache).    . Multiple Vitamin (MULTIVITAMIN) tablet Take 1 tablet by mouth daily.     No current facility-administered medications on file prior to visit.     BP 116/74   Pulse 81   Temp 98.1 F (36.7 C) (Oral)   Ht 5\' 1"  (1.549 m)   Wt 120 lb 3.2 oz (54.5 kg)   SpO2 98%   BMI 22.71 kg/m    Objective:   Physical Exam  Constitutional: She appears well-nourished.  HENT:  Right Ear: Tympanic membrane and ear canal  normal.  Left Ear: Tympanic membrane and ear canal normal.  Nose: Mucosal edema present. Right sinus exhibits no maxillary sinus tenderness and no frontal sinus tenderness. Left sinus exhibits no maxillary sinus tenderness and no frontal sinus tenderness.  Mouth/Throat: Oropharynx is clear and moist.  Eyes: Conjunctivae are normal.  Neck: Neck supple.  Cardiovascular: Normal rate and regular rhythm.  Pulmonary/Chest: Effort normal and breath sounds normal. She has no wheezes. She has no rales.  Abdominal: Soft. Bowel sounds are normal. There is no tenderness.  Lymphadenopathy:    She has no cervical adenopathy.  Skin: Skin is warm and dry.          Assessment & Plan:  Emergency Department Follow Up:  Diarrhea x 5 days. ED work up negative for bacterial cause. Diagnosed with viral involvement. No diarrhea in 4 days, continues to tolerate food and liquids without difficulty. Discussed a BRAT diet. She will update if symptoms return. Continue Pepcid for GERD.  All hospital labs and notes reviewed. Morrie Sheldon, NP

## 2017-05-26 NOTE — Assessment & Plan Note (Signed)
Improved on Pepcid 20 mg BID, continue same.  Refills sent to pharmacy.

## 2017-10-07 ENCOUNTER — Emergency Department: Payer: BLUE CROSS/BLUE SHIELD

## 2017-10-07 ENCOUNTER — Emergency Department
Admission: EM | Admit: 2017-10-07 | Discharge: 2017-10-07 | Disposition: A | Discharge status: Home / Self Care | Payer: BLUE CROSS/BLUE SHIELD | Attending: Emergency Medicine | Admitting: Emergency Medicine

## 2017-10-07 DIAGNOSIS — R110 Nausea: Secondary | ICD-10-CM | POA: Diagnosis not present

## 2017-10-07 DIAGNOSIS — R202 Paresthesia of skin: Secondary | ICD-10-CM | POA: Diagnosis not present

## 2017-10-07 DIAGNOSIS — Z79899 Other long term (current) drug therapy: Secondary | ICD-10-CM | POA: Diagnosis not present

## 2017-10-07 DIAGNOSIS — R42 Dizziness and giddiness: Secondary | ICD-10-CM | POA: Diagnosis not present

## 2017-10-07 DIAGNOSIS — F17210 Nicotine dependence, cigarettes, uncomplicated: Secondary | ICD-10-CM | POA: Diagnosis not present

## 2017-10-07 LAB — CBC
HCT: 41.7 % (ref 35.0–47.0)
Hemoglobin: 14.5 g/dL (ref 12.0–16.0)
MCH: 31.1 pg (ref 26.0–34.0)
MCHC: 34.7 g/dL (ref 32.0–36.0)
MCV: 89.5 fL (ref 80.0–100.0)
Platelets: 280 10*3/uL (ref 150–440)
RBC: 4.66 MIL/uL (ref 3.80–5.20)
RDW: 13.4 % (ref 11.5–14.5)
WBC: 10.5 10*3/uL (ref 3.6–11.0)

## 2017-10-07 LAB — URINE DRUG SCREEN, QUALITATIVE (ARMC ONLY)
Amphetamines, Ur Screen: NOT DETECTED
Barbiturates, Ur Screen: NOT DETECTED
Benzodiazepine, Ur Scrn: NOT DETECTED
Cannabinoid 50 Ng, Ur ~~LOC~~: NOT DETECTED
Cocaine Metabolite,Ur ~~LOC~~: NOT DETECTED
MDMA (Ecstasy)Ur Screen: NOT DETECTED
Methadone Scn, Ur: NOT DETECTED
Opiate, Ur Screen: NOT DETECTED
Phencyclidine (PCP) Ur S: NOT DETECTED
Tricyclic, Ur Screen: NOT DETECTED

## 2017-10-07 LAB — URINALYSIS, COMPLETE (UACMP) WITH MICROSCOPIC
Bacteria, UA: NONE SEEN
Bilirubin Urine: NEGATIVE
Glucose, UA: NEGATIVE mg/dL
Hgb urine dipstick: NEGATIVE
Ketones, ur: NEGATIVE mg/dL
Leukocytes, UA: NEGATIVE
Nitrite: NEGATIVE
Protein, ur: NEGATIVE mg/dL
Specific Gravity, Urine: 1.004 — ABNORMAL LOW (ref 1.005–1.030)
WBC, UA: NONE SEEN WBC/hpf (ref 0–5)
pH: 6 (ref 5.0–8.0)

## 2017-10-07 LAB — BASIC METABOLIC PANEL
Anion gap: 8 (ref 5–15)
BUN: 11 mg/dL (ref 6–20)
CO2: 23 mmol/L (ref 22–32)
Calcium: 9.2 mg/dL (ref 8.9–10.3)
Chloride: 107 mmol/L (ref 101–111)
Creatinine, Ser: 0.89 mg/dL (ref 0.44–1.00)
GFR calc Af Amer: >60 mL/min (ref >60)
GFR calc non Af Amer: >60 mL/min (ref >60)
Glucose, Bld: 93 mg/dL (ref 65–99)
Potassium: 3.8 mmol/L (ref 3.5–5.1)
Sodium: 138 mmol/L (ref 135–145)

## 2017-10-07 LAB — TROPONIN I: Troponin I: <0.03 ng/mL (ref <0.03)

## 2017-10-07 LAB — GLUCOSE, CAPILLARY: Glucose-Capillary: 92 mg/dL (ref 65–99)

## 2017-10-07 MED ORDER — MECLIZINE HCL 12.5 MG PO TABS: 12.5000 mg | ORAL_TABLET | Freq: Three times a day (TID) | ORAL | 0 refills | Status: DC | PRN

## 2017-10-07 MED ORDER — MECLIZINE HCL 25 MG PO TABS
12.5000 mg | ORAL_TABLET | Freq: Once | ORAL | Status: AC
  Administered 2017-10-07: 12.5 mg via ORAL

## 2017-10-07 MED ORDER — ONDANSETRON HCL 4 MG/2ML IJ SOLN
4.0000 mg | Freq: Once | INTRAMUSCULAR | Status: AC
Start: 2017-10-07 — End: 2017-10-07
  Administered 2017-10-07: 4 mg via INTRAVENOUS

## 2017-10-07 MED ORDER — MECLIZINE HCL 25 MG PO TABS
ORAL_TABLET | ORAL | Status: AC
  Administered 2017-10-07: 12.5 mg via ORAL
  Filled 2017-10-07: qty 1

## 2017-10-07 MED ORDER — SODIUM CHLORIDE 0.9 % IV BOLUS
1000.0000 mL | Freq: Once | INTRAVENOUS | Status: AC
  Administered 2017-10-07: 1000 mL via INTRAVENOUS

## 2017-10-07 MED ORDER — ONDANSETRON HCL 4 MG/2ML IJ SOLN
INTRAMUSCULAR | Status: AC
  Administered 2017-10-07: 4 mg via INTRAVENOUS
  Filled 2017-10-07: qty 2

## 2017-10-07 NOTE — ED Provider Notes (Signed)
[facility] Emergency Department Provider Note  ____________________________________________   None    (approximate)  I have reviewed the triage vital signs and the nursing notes.   HISTORY  Chief Complaint Dizziness and Emesis    HPI Erika Brock is a 42 y.o. female with medical and psychiatric history as listed below who presents for evaluation of acute onset dizziness and emesis.  The symptoms started tonight at work.  She reports that initially she had some bilateral hand and foot tingling but she did not think much of that because she knows that she has a bulging disc in her neck and this happens sometimes.  May be an hour later she suddenly felt like she was moving when she was not and she became nauseated.  She has thrown up a couple of times.  As long as she is remaining still she feels "okay" but changing positions and moving around makes her feel worse.  She also feels unsteady of gait and off balance.  She feels better after Zofran 4 mg IV.  She describes the symptoms of his acute in onset and severe.  She has not had similar symptoms in the past.  She has no prior diagnosis of peripheral vertigo.  She says that she also has a very mild generalized throbbing headache.  She has no neck pain or stiffness.  She denies fever/chills, visual changes, chest pain, shortness of breath, nausea, vomiting, and abdominal pain.  She has had no recent dysuria.  Past Medical History:  Diagnosis Date  . Anemia    NOS  . Depression   . Headache(784.0)   . Migraine     Patient Active Problem List   Diagnosis Date Noted  . Gastroesophageal reflux disease 05/26/2017  . PTSD (post-traumatic stress disorder) 01/24/2017  . Fever 12/05/2014  . Abnormal ultrasound of pelvis 09/24/2013  . Abdominal pain, left lower quadrant 09/13/2013  . POOR CONCENTRATION 02/17/2009  . Depression with anxiety 02/17/2009  . ANEMIA, HX OF 02/17/2009    Past Surgical History:    Procedure Laterality Date  . VAGINAL HYSTERECTOMY  2002   Prolapse, unsure if she still has ovaries    Prior to Admission medications   Medication Sig Start Date End Date Taking? Authorizing Provider  acetaminophen (TYLENOL) 325 MG tablet Take 650 mg by mouth every 6 (six) hours as needed for mild pain or fever.    [provider]  escitalopram (LEXAPRO) 20 MG tablet Take 1 tablet (20 mg total) by mouth daily. 01/24/17   Dianne Dun, MD  famotidine (PEPCID) 20 MG tablet Take 1 tablet (20 mg total) by mouth 2 (two) times daily. 05/26/17   Doreene Nest, NP  Ibuprofen (ADVIL MIGRAINE PO) Take 2 tablets by mouth daily as needed (headache).    [provider]  meclizine (ANTIVERT) 12.5 MG tablet Take 1 tablet (12.5 mg total) by mouth 3 (three) times daily as needed for dizziness or nausea. 10/07/17   Loleta Rose, MD  Multiple Vitamin (MULTIVITAMIN) tablet Take 1 tablet by mouth daily.    [provider]    Allergies Prozac [fluoxetine hcl] and Penicillins  Family History  Problem Relation Age of Onset  . Diabetes Mother   . Bipolar disorder Mother   . Diabetes Father   . Diabetes Daughter   . ADD / ADHD Son   . Diabetes Brother     Social History Social History   Tobacco Use  . Smoking status: Current Every  Day Smoker    Packs/day: 0.25    Years: 16.00    Pack years: 4.00    Types: Cigarettes  . Smokeless tobacco: Never Used  . Tobacco comment: smokes between 5-10 cigaretts a day  Substance Use Topics  . Alcohol use: No  . Drug use: No    Review of Systems Constitutional: No fever/chills Eyes: No visual changes. ENT: No sore throat. Cardiovascular: Denies chest pain. Respiratory: Denies shortness of breath. Gastrointestinal: No abdominal pain.  Nausea and vomiting as described above.  No diarrhea.  No constipation. Genitourinary: Negative for dysuria. Musculoskeletal: Negative for neck pain.  Negative for back pain. Integumentary:  Negative for rash. Neurological: Acute onset dizziness, unsteadiness of gait, brief episode of numbness and tingling in bilateral hands and feet (not uncommon for her), no focal weakness.   ____________________________________________   PHYSICAL EXAM:  VITAL SIGNS: ED Triage Vitals  Enc Vitals Group     BP 10/07/17 0128 132/75     Pulse Rate 10/07/17 0128 93     Resp 10/07/17 0128 18     Temp 10/07/17 0128 97.7 F (36.5 C)     Temp Source 10/07/17 0128 Oral     SpO2 10/07/17 0128 99 %     Weight 10/07/17 0129 56.7 kg (125 lb)     Height --      Head Circumference --      Peak Flow --      Pain Score 10/07/17 0129 7     Pain Loc --      Pain Edu? --      Excl. in GC? --     Constitutional: Alert and oriented. Well appearing and in no acute distress. Eyes: Conjunctivae are normal. PERRL. EOMI. no nystagmus. Head: Atraumatic. Nose: No congestion/rhinnorhea. Mouth/Throat: Mucous membranes are moist. Neck: No stridor.  No meningeal signs.   Cardiovascular: Normal rate, regular rhythm. Good peripheral circulation. Grossly normal heart sounds. Respiratory: Normal respiratory effort.  No retractions. Lungs CTAB. Gastrointestinal: Soft and nontender. No distention.  Musculoskeletal: No lower extremity tenderness nor edema. No gross deformities of extremities. Neurologic:  Normal speech and language. No gross focal neurologic deficits are appreciated but the patient feels too unsteady and nauseated to try to ambulate or test pronator drift and Romberg at this time.  Normal finger-to-nose testing.  Normal major muscle group strength. Skin:  Skin is warm, dry and intact. No rash noted. Psychiatric: Mood and affect are normal. Speech and behavior are normal.  ____________________________________________   LABS (all labs ordered are listed, but only abnormal results are displayed)  Labs Reviewed  URINALYSIS, COMPLETE (UACMP) WITH MICROSCOPIC - Abnormal; Notable for the following  components:      Result Value   Color, Urine STRAW (*)    APPearance CLEAR (*)    Specific Gravity, Urine 1.004 (*)    All other components within normal limits  BASIC METABOLIC PANEL  CBC  TROPONIN I  GLUCOSE, CAPILLARY  URINE DRUG SCREEN, QUALITATIVE ([facility] ONLY)  CBG MONITORING, ED   ____________________________________________  EKG  ED ECG REPORT I, Loleta Roseory Channing Yeager, the attending physician, personally viewed and interpreted this ECG.  Date: 10/07/2017 EKG Time: 1:31 AM Rate: 82 Rhythm: normal sinus rhythm QRS Axis: normal Intervals: normal ST/T Wave abnormalities: normal Narrative Interpretation: no evidence of acute ischemia  ____________________________________________  RADIOLOGY   ED MD interpretation:  Patient could not tolerate MRI  Official radiology report(s): No results found.  ____________________________________________   PROCEDURES  Critical Care performed: No  Procedure(s) performed:   Procedures   ____________________________________________   INITIAL IMPRESSION / ASSESSMENT AND PLAN / ED COURSE  As part of my medical decision making, I reviewed the following data within the electronic MEDICAL RECORD NUMBER Nursing notes reviewed and incorporated, Labs reviewed , EKG interpreted , Old chart reviewed and Notes from prior ED visits    Differential diagnosis includes, but is not limited to, electrolyte abnormality, viral or bacterial illness, peripheral vertigo, CVA, much less likely ACS.  The patient's symptoms are relatively subtle which would actually potentially point towards more of a central cause.  She is feeling better after Zofran and normal saline but still does not feel normal.  Given the acute onset and the combination of tingling (which may be her regular cervical spine disc herniation) and the subtle vertigo symptoms, I think it is reasonable to obtain an MRI brain without contrast.  She agrees.  We will proceed with imaging and  reassess.  Clinical Course as of Oct 07 529  Fri Oct 07, 2017  1610 Due to a prior traumatic experience for which the patient suffers PTSD, she was not able to tolerate the MRI and does not want to try again even with sedation.  However as I was coming back into the room I observe that she was ambulating around the room without any difficulty, even bending over and picking something up off the floor without any symptoms.  When asked her but she confirms that she is feeling much better.  She was able to ambulate without any ataxia, and I completed my neurological exam and she has no pronator drift and negative Romberg.    I will give her a dose of meclizine 12.5 mg by mouth so that she can see whether or not it makes her sleepy and I will give her a prescription for medication to take as needed.  She understands that I cannot rule out a small CVA, but having seen her ambulating and bending over without any recurrence of her symptoms, I am reassured that it is unlikely.  I gave my usual and customary return precautions.  She understands and agrees with the plan.   [CF]    Clinical Course User Index [CF] Loleta Rose, MD    ____________________________________________  FINAL CLINICAL IMPRESSION(S) / ED DIAGNOSES  Final diagnoses:  Vertigo  Nausea     MEDICATIONS GIVEN DURING THIS VISIT:  Medications  ondansetron (ZOFRAN) injection 4 mg (4 mg Intravenous Given 10/07/17 0257)  sodium chloride 0.9 % bolus 1,000 mL (0 mLs Intravenous Stopped 10/07/17 0504)  meclizine (ANTIVERT) tablet 12.5 mg (12.5 mg Oral Given 10/07/17 0507)     ED Discharge Orders        Ordered    meclizine (ANTIVERT) 12.5 MG tablet  3 times daily PRN     10/07/17 0516       Note:  This document was prepared using Dragon voice recognition software and may include unintentional dictation errors.    Loleta Rose, MD 10/07/17 [phone]

## 2017-10-07 NOTE — ED Triage Notes (Signed)
Patient c/o dizziness and emesis beginning at 2320. Patient reports bilateral hand and foot numbness that has since resolved. Patient reports upper left abdominal pain that has since resolved.

## 2017-10-07 NOTE — Discharge Instructions (Addendum)
As we discussed, you were not able to undergo the MRI of your brain which is understandable given the circumstances.  Since your symptoms have almost if not completely resolved, we think it is appropriate for you to be discharged and follow-up as an outpatient as needed.  All of your lab work was reassuring today.  Please try the prescribed medication as needed for symptoms of dizziness.  Return to the emergency department if you develop new or worsening symptoms that concern you.

## 2017-10-10 DIAGNOSIS — R112 Nausea with vomiting, unspecified: Secondary | ICD-10-CM | POA: Diagnosis not present

## 2017-10-10 DIAGNOSIS — G43909 Migraine, unspecified, not intractable, without status migrainosus: Secondary | ICD-10-CM | POA: Diagnosis not present

## 2017-10-13 ENCOUNTER — Encounter: Payer: Self-pay | Admitting: Primary Care

## 2017-10-13 ENCOUNTER — Ambulatory Visit (INDEPENDENT_AMBULATORY_CARE_PROVIDER_SITE_OTHER): Payer: BLUE CROSS/BLUE SHIELD | Admitting: Primary Care

## 2017-10-13 VITALS — BP 120/70 | HR 92 | Temp 98.0°F | Ht 61.0 in | Wt 125.5 lb

## 2017-10-13 DIAGNOSIS — R42 Dizziness and giddiness: Secondary | ICD-10-CM | POA: Diagnosis not present

## 2017-10-13 NOTE — Progress Notes (Signed)
Subjective:    Patient ID: Erika Brock, female    DOB: 18-Aug-1975, 42 y.o.   MRN: [id number]  HPI  Ms. Erika Brock is a 42 year old female who presents today for emergency department follow up.  [facility] ED on 10/07/17 with a chief complaint of dizziness and vomiting. Her symptoms began during her night shift at work with bilateral hand and foot tingling. She then felt as though her body was moving and then vomited a few times, experienced a "throbbing" headache.  During her stay in the ED she underwent lab work (unremarkable). She was treated with IV fluids and Zofran. It was recommended she undergo MRI of the brain but she couldn't handle the feeling of claustrophobia due to PTSD. She was treated with oral Meclizine. She was observed to be ambulating well in her room bending over and picking up objects without difficulty so CVA suspicion was lowered. She was discharged home.  Since her ED visit she continues to feel dizzy. She went to [facility] Monday night this week and was provided with "2 shots" for nausea and headaches. She did feel better after that visit, but her headache and nausea returned. She continues to feel nauseated and shaky, headaches are located to the left frontal, parietal, and occipital lobes. She's vomiting once daily on average. She's been taking Meclizine 12.5 mg once daily on average with some improvement.   She feels like her body is moving. She denies fevers, chills, rhinorrhea. She admits to being under a lot of stress recently.   Review of Systems  Constitutional: Negative for fever.  HENT: Positive for rhinorrhea and sinus pressure.   Respiratory: Negative for shortness of breath.   Cardiovascular: Negative for chest pain.  Gastrointestinal: Positive for nausea and vomiting. Negative for abdominal pain.  Neurological: Positive for dizziness and light-headedness.       Past Medical History:  Diagnosis Date  . Anemia    NOS  .  Depression   . Headache(784.0)   . Migraine      Social History   Socioeconomic History  . Marital status: Married    Spouse name: Not on file  . Number of children: Not on file  . Years of education: Not on file  . Highest education level: Not on file  Occupational History  . Not on file  Social Needs  . Financial resource strain: Not on file  . Food insecurity:    Worry: Not on file    Inability: Not on file  . Transportation needs:    Medical: Not on file    Non-medical: Not on file  Tobacco Use  . Smoking status: Current Every Day Smoker    Packs/day: 0.25    Years: 16.00    Pack years: 4.00    Types: Cigarettes  . Smokeless tobacco: Never Used  . Tobacco comment: smokes between 5-10 cigaretts a day  Substance and Sexual Activity  . Alcohol use: No  . Drug use: No  . Sexual activity: Not on file  Lifestyle  . Physical activity:    Days per week: Not on file    Minutes per session: Not on file  . Stress: Not on file  Relationships  . Social connections:    Talks on phone: Not on file    Gets together: Not on file    Attends religious service: Not on file    Active member of club or organization: Not on file  Attends meetings of clubs or organizations: Not on file    Relationship status: Not on file  . Intimate partner violence:    Fear of current or ex partner: Not on file    Emotionally abused: Not on file    Physically abused: Not on file    Forced sexual activity: Not on file  Other Topics Concern  . Not on file  Social History Narrative   Lives with husband and 4 children in [place].  Moved from [place] 3 years ago.      Past Surgical History:  Procedure Laterality Date  . VAGINAL HYSTERECTOMY  2002   Prolapse, unsure if she still has ovaries    Family History  Problem Relation Age of Onset  . Diabetes Mother   . Bipolar disorder Mother   . Diabetes Father   . Diabetes Daughter   . ADD / ADHD Son   . Diabetes Brother      Allergies  Allergen Reactions  . Prozac [Fluoxetine Hcl] Swelling  . Penicillins Hives, Swelling and Other (See Comments)    REACTION: Severe reaction.    Current Outpatient Medications on File Prior to Visit  Medication Sig Dispense Refill  . acetaminophen (TYLENOL) 325 MG tablet Take 650 mg by mouth every 6 (six) hours as needed for mild pain or fever.    . famotidine (PEPCID) 20 MG tablet Take 1 tablet (20 mg total) by mouth 2 (two) times daily. 180 tablet 0  . Ibuprofen (ADVIL MIGRAINE PO) Take 2 tablets by mouth daily as needed (headache).    . Multiple Vitamin (MULTIVITAMIN) tablet Take 1 tablet by mouth daily.     No current facility-administered medications on file prior to visit.     BP 120/70   Pulse 92   Temp 98 F (36.7 C) (Oral)   Ht 5\' 1"  (1.549 m)   Wt 125 lb 8 oz (56.9 kg)   SpO2 97%   BMI 23.71 kg/m    Objective:   Physical Exam  Constitutional: She appears well-nourished.  Neck: Neck supple.  Cardiovascular: Normal rate and regular rhythm.  Respiratory: Effort normal and breath sounds normal.  Skin: Skin is warm and dry.  Psychiatric: She has a normal mood and affect.           Assessment & Plan:  Vertigo:  Diagnosed at [facility] ED on 10/07/17. Continued symptoms since evaluation, also since Urgent Care evaluation this week. Neuro exam today unremarkable, she ambulates well in the room and is able to get up and off on the exam table without difficulty.  Do suspect vertigo at this point. Discussed to start taking Meclizine three times daily as prescribed for the next 3-4 days. She does not feel drowsy after taking. Handout provided regarding Epley Maneuvers. Consider ENT evaluation if symptoms persist. She will update in 3-4 days, work note provided.  All ED notes and labs reviewed.  Doreene Nest, NP

## 2017-10-13 NOTE — Patient Instructions (Signed)
Resume Meclizine and take three times daily for vertigo for 3-4 days.   Try the maneuvers as discussed and listed below.  Please notify me if your symptoms do not improve within a few days.  It was a pleasure to see you today!  How to Perform the Epley Maneuver The Epley maneuver is an exercise that relieves symptoms of vertigo. Vertigo is the feeling that you or your surroundings are moving when they are not. When you feel vertigo, you may feel like the room is spinning and have trouble walking. Dizziness is a little different than vertigo. When you are dizzy, you may feel unsteady or light-headed. You can do this maneuver at home whenever you have symptoms of vertigo. You can do it up to 3 times a day until your symptoms go away. Even though the Epley maneuver may relieve your vertigo for a few weeks, it is possible that your symptoms will return. This maneuver relieves vertigo, but it does not relieve dizziness. What are the risks? If it is done correctly, the Epley maneuver is considered safe. Sometimes it can lead to dizziness or nausea that goes away after a short time. If you develop other symptoms, such as changes in vision, weakness, or numbness, stop doing the maneuver and call your health care provider. How to perform the Epley maneuver 1. Sit on the edge of a bed or table with your back straight and your legs extended or hanging over the edge of the bed or table. 2. Turn your head halfway toward the affected ear or side. 3. Lie backward quickly with your head turned until you are lying flat on your back. You may want to position a pillow under your shoulders. 4. Hold this position for 30 seconds. You may experience an attack of vertigo. This is normal. 5. Turn your head to the opposite direction until your unaffected ear is facing the floor. 6. Hold this position for 30 seconds. You may experience an attack of vertigo. This is normal. Hold this position until the vertigo  stops. 7. Turn your whole body to the same side as your head. Hold for another 30 seconds. 8. Sit back up. You can repeat this exercise up to 3 times a day. Follow these instructions at home:  After doing the Epley maneuver, you can return to your normal activities.  Ask your health care provider if there is anything you should do at home to prevent vertigo. He or she may recommend that you: ? Keep your head raised (elevated) with two or more pillows while you sleep. ? Do not sleep on the side of your affected ear. ? Get up slowly from bed. ? Avoid sudden movements during the day. ? Avoid extreme head movement, like looking up or bending over. Contact a health care provider if:  Your vertigo gets worse.  You have other symptoms, including: ? Nausea. ? Vomiting. ? Headache. Get help right away if:  You have vision changes.  You have a severe or worsening headache or neck pain.  You cannot stop vomiting.  You have new numbness or weakness in any part of your body. Summary  Vertigo is the feeling that you or your surroundings are moving when they are not.  The Epley maneuver is an exercise that relieves symptoms of vertigo.  If the Epley maneuver is done correctly, it is considered safe. You can do it up to 3 times a day. This information is not intended to replace advice given to you  by your health care provider. Make sure you discuss any questions you have with your health care provider. Document Released: 05/01/2013 Document Revised: 03/16/2016 Document Reviewed: 03/16/2016 Elsevier Interactive Patient Education  2017 [employer]

## 2018-08-14 ENCOUNTER — Encounter: Payer: Self-pay | Admitting: Primary Care

## 2018-08-14 ENCOUNTER — Ambulatory Visit (INDEPENDENT_AMBULATORY_CARE_PROVIDER_SITE_OTHER): Payer: BLUE CROSS/BLUE SHIELD | Admitting: Primary Care

## 2018-08-14 ENCOUNTER — Telehealth: Payer: Self-pay | Admitting: Primary Care

## 2018-08-14 ENCOUNTER — Other Ambulatory Visit: Payer: Self-pay

## 2018-08-14 DIAGNOSIS — Z20822 Contact with and (suspected) exposure to covid-19: Secondary | ICD-10-CM | POA: Insufficient documentation

## 2018-08-14 DIAGNOSIS — J069 Acute upper respiratory infection, unspecified: Secondary | ICD-10-CM

## 2018-08-14 DIAGNOSIS — B9789 Other viral agents as the cause of diseases classified elsewhere: Secondary | ICD-10-CM

## 2018-08-14 NOTE — Telephone Encounter (Signed)
Noted  

## 2018-08-14 NOTE — Progress Notes (Signed)
Subjective:    Patient ID: Erika Brock, female    DOB: [birth year], 43 y.o.   MRN: [id number]  HPI  Virtual Visit via Video Note  I connected with Erika Brock on 08/14/18 at 9:50 am by a video enabled telemedicine application and verified that I am speaking with the correct person using two identifiers.   I discussed the limitations of evaluation and management by telemedicine and the availability of in person appointments. The patient expressed understanding and agreed to proceed. She is at home, I am in the office.  History of Present Illness:  Erika Brock is a 43 year old female with a history of GERD who presents today via video with a chief complaint of fever.  She also reports cough, headache, body aches, feeling shaky, chest discomfort after coughing, sore throat, rhinorrhea, decrease in taste. Her symptoms began three days ago. Her temperature was 102.2 this morning. 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 She's taken Advil, OTC allergy medication without much improvement. She's not taken her temperature since early this morning. She smokes cigarettes and is smoking 10-15 daily, denies increased sputum production and productive cough.    Observations/Objective:  Appears sick. Dry cough during exam. Alert and oriented. No respiratory distress noted.  Assessment and Plan:  Symptoms are suspicious for Covid-19 but cannot be confirmed as we are not testing. We would not bring her into the clinic for testing as she has active symptoms and could infect others.   I recommended she stay home for 7 days from symptom start and may not return to work until she's been afebrile for 3 days without the use of antipyretics and symptoms have resolved. We will fax a note to her employer now.  We discussed other treatment for symptoms including Advil/Tylenol, Delsym/Robitussin, antihistamine/Flonase.   Follow Up Instructions:  Stay home. You may not  return to work until your symptoms have improved and you are without fevers for 3 days without the use of fever reducing medication (Advil, Motrin, ibuprofen, Tylenol, acetaminophen).   You can try a few things over the counter to help with your symptoms including:  Cough: Delsym or Robitussin (get the off brand, works just as well) Chest Congestion: Mucinex (plain) Nasal Congestion/Ear Pressure/Sinus Pressure: Try using Flonase (fluticasone) nasal spray. Instill 1 spray in each nostril twice daily. This can be purchased over the counter. Body aches, fevers, headache: Ibuprofen (not to exceed 2400 mg in 24 hours) or Acetaminophen-Tylenol (not to exceed 3000 mg in 24 hours) Runny Nose/Throat Drainage/Sneezing/Itchy or Watery Eyes: An antihistamine such as Zyrtec, Claritin, Xyzal, Allegra  It was nice to see you!   I discussed the assessment and treatment plan with the patient. The patient was provided an opportunity to ask questions and all were answered. The patient agreed with the plan and demonstrated an understanding of the instructions.   The patient was advised to call back or seek an in-person evaluation if the symptoms worsen or if the condition fails to improve as anticipated.    Doreene Nest, NP    Review of Systems  Constitutional: Positive for chills, fatigue and fever.  HENT: Positive for rhinorrhea and sore throat. Negative for ear pain and sinus pressure.   Respiratory: Positive for cough. Negative for shortness of breath.   Cardiovascular: Negative for chest pain.  Neurological: Positive for headaches.       Past Medical History:  Diagnosis Date  . Anemia  NOS  . Depression   . Headache(784.0)   . Migraine      Social History   Socioeconomic History  . Marital status: Married    Spouse name: Not on file  . Number of children: Not on file  . Years of education: Not on file  . Highest education level: Not on file  Occupational History  . Not on  file  Social Needs  . Financial resource strain: Not on file  . Food insecurity:    Worry: Not on file    Inability: Not on file  . Transportation needs:    Medical: Not on file    Non-medical: Not on file  Tobacco Use  . Smoking status: Current Every Day Smoker    Packs/day: 0.25    Years: 16.00    Pack years: 4.00    Types: Cigarettes  . Smokeless tobacco: Never Used  . Tobacco comment: smokes between 5-10 cigaretts a day  Substance and Sexual Activity  . Alcohol use: No  . Drug use: No  . Sexual activity: Not on file  Lifestyle  . Physical activity:    Days per week: Not on file    Minutes per session: Not on file  . Stress: Not on file  Relationships  . Social connections:    Talks on phone: Not on file    Gets together: Not on file    Attends religious service: Not on file    Active member of club or organization: Not on file    Attends meetings of clubs or organizations: Not on file    Relationship status: Not on file  . Intimate partner violence:    Fear of current or ex partner: Not on file    Emotionally abused: Not on file    Physically abused: Not on file    Forced sexual activity: Not on file  Other Topics Concern  . Not on file  Social History Narrative   Lives with husband and 4 children in [place].  Moved from [place] 3 years ago.      Past Surgical History:  Procedure Laterality Date  . VAGINAL HYSTERECTOMY  2002   Prolapse, unsure if she still has ovaries    Family History  Problem Relation Age of Onset  . Diabetes Mother   . Bipolar disorder Mother   . Diabetes Father   . Diabetes Daughter   . ADD / ADHD Son   . Diabetes Brother     Allergies  Allergen Reactions  . Prozac [Fluoxetine Hcl] Swelling  . Penicillins Hives, Swelling and Other (See Comments)    REACTION: Severe reaction.    Current Outpatient Medications on File Prior to Visit  Medication Sig Dispense Refill  . acetaminophen (TYLENOL) 325 MG tablet Take 650 mg by  mouth every 6 (six) hours as needed for mild pain or fever.    . famotidine (PEPCID) 20 MG tablet Take 1 tablet (20 mg total) by mouth 2 (two) times daily. 180 tablet 0  . Ibuprofen (ADVIL MIGRAINE PO) Take 2 tablets by mouth daily as needed (headache).    . Multiple Vitamin (MULTIVITAMIN) tablet Take 1 tablet by mouth daily.     No current facility-administered medications on file prior to visit.     There were no vitals taken for this visit.   Objective:   Physical Exam  Constitutional: She is oriented to person, place, and time. She appears ill.  Respiratory: She is in respiratory distress.  Dry cough during  exam  Neurological: She is alert and oriented to person, place, and time.  Psychiatric: She has a normal mood and affect.           Assessment & Plan:

## 2018-08-14 NOTE — Telephone Encounter (Signed)
Pt. Calling to ask where she can go for COVID 19 testing. Pt. Had a visit this morning with her provider. Understands that we are currently not testing in the office. States her employer is requiring her to be tested. Instructed her to call her local Department of health . Given the phone number for [place] (states that is her county of residence).

## 2018-08-14 NOTE — Assessment & Plan Note (Signed)
Symptoms are suspicious for Covid-19 but cannot be confirmed as we are not testing. We would not bring her into the clinic for testing as she has active symptoms and could infect others.   I recommended she stay home for 7 days from symptom start and may not return to work until she's been afebrile for 3 days without the use of antipyretics and symptoms have resolved. We will fax a note to her employer now.  We discussed other treatment for symptoms including Advil/Tylenol, Delsym/Robitussin, antihistamine/Flonase.

## 2018-08-14 NOTE — Patient Instructions (Addendum)
Stay home. You may not return to work until your symptoms have improved and you are without fevers for 3 days without the use of fever reducing medication (Advil, Motrin, ibuprofen, Tylenol, acetaminophen).   You can try a few things over the counter to help with your symptoms including:  Cough: Delsym or Robitussin (get the off brand, works just as well) Chest Congestion: Mucinex (plain) Nasal Congestion/Ear Pressure/Sinus Pressure: Try using Flonase (fluticasone) nasal spray. Instill 1 spray in each nostril twice daily. This can be purchased over the counter. Body aches, fevers, headache: Ibuprofen (not to exceed 2400 mg in 24 hours) or Acetaminophen-Tylenol (not to exceed 3000 mg in 24 hours) Runny Nose/Throat Drainage/Sneezing/Itchy or Watery Eyes: An antihistamine such as Zyrtec, Claritin, Xyzal, Allegra  It was nice to see you!

## 2019-04-13 ENCOUNTER — Encounter: Payer: Self-pay | Admitting: Family Medicine

## 2019-04-13 ENCOUNTER — Ambulatory Visit (INDEPENDENT_AMBULATORY_CARE_PROVIDER_SITE_OTHER): Payer: BC Managed Care – PPO | Admitting: Family Medicine

## 2019-04-13 ENCOUNTER — Other Ambulatory Visit: Payer: Self-pay

## 2019-04-13 VITALS — Temp 98.5°F | Ht 61.5 in | Wt 128.0 lb

## 2019-04-13 DIAGNOSIS — J069 Acute upper respiratory infection, unspecified: Secondary | ICD-10-CM | POA: Diagnosis not present

## 2019-04-13 DIAGNOSIS — Z20822 Contact with and (suspected) exposure to covid-19: Secondary | ICD-10-CM

## 2019-04-13 NOTE — Progress Notes (Signed)
Virtual Visit via Video Note  I connected with Erika Brock on 04/13/19 at 10:45 AM EST by a video enabled telemedicine application and verified that I am speaking with the correct person using two identifiers.  Location: Patient: home Provider: office   Parties involved in encounter:  Patient   Tammey Deeg Treating physician    Roxy Manns MD    I discussed the limitations of evaluation and management by telemedicine and the availability of in person appointments. The patient expressed understanding and agreed to proceed.  History of Present Illness: Here for uri symptoms   Has been more fatigued lately  Slept Tuesday most of the day after working long hours  Wednesday- was not feeling great  Woke up yesterday- exhausted / and slept over 10 hours   Head hurts  Congested - clear mucous so far Cough - hear rattle/nothing is coming up yet  No wheezing  Some ST   Smoking status  Has cut back to 5 cig per day- trying to quit   No fever  Body feels weak -not achey Has had some chills- dressing very warm /blankets   No loss of taste or smell   Hands feel tight/swollen   Otc: mucous relief DM  She took some advil for headache  Drinking lots of water   Sick contacts :  3 weeks ago worked with people that later had covid (not at all close)   Needs a note for work     Patient Active Problem List   Diagnosis Date Noted  . Viral URI with cough 08/14/2018  . Gastroesophageal reflux disease 05/26/2017  . PTSD (post-traumatic stress disorder) 01/24/2017  . Abnormal ultrasound of pelvis 09/24/2013  . Abdominal pain, left lower quadrant 09/13/2013  . POOR CONCENTRATION 02/17/2009  . Depression with anxiety 02/17/2009  . ANEMIA, HX OF 02/17/2009   Past Medical History:  Diagnosis Date  . Anemia    NOS  . Depression   . Headache(784.0)   . Migraine    Past Surgical History:  Procedure Laterality Date  . VAGINAL HYSTERECTOMY  2002   Prolapse, unsure if she  still has ovaries   Social History   Tobacco Use  . Smoking status: Current Every Day Smoker    Packs/day: 0.25    Years: 16.00    Pack years: 4.00    Types: Cigarettes  . Smokeless tobacco: Never Used  . Tobacco comment: smokes between 5-10 cigaretts a day  Substance Use Topics  . Alcohol use: No  . Drug use: No   Family History  Problem Relation Age of Onset  . Diabetes Mother   . Bipolar disorder Mother   . Diabetes Father   . Diabetes Daughter   . ADD / ADHD Son   . Diabetes Brother    Allergies  Allergen Reactions  . Prozac [Fluoxetine Hcl] Swelling  . Penicillins Hives, Swelling and Other (See Comments)    REACTION: Severe reaction.   Current Outpatient Medications on File Prior to Visit  Medication Sig Dispense Refill  . Ibuprofen (ADVIL MIGRAINE PO) Take 2 tablets by mouth daily as needed (headache).    . Multiple Vitamin (MULTIVITAMIN) tablet Take 1 tablet by mouth daily.    Marland Kitchen acetaminophen (TYLENOL) 325 MG tablet Take 650 mg by mouth every 6 (six) hours as needed for mild pain or fever.    . famotidine (PEPCID) 20 MG tablet Take 1 tablet (20 mg total) by mouth 2 (two) times daily. (Patient not taking: Reported  on 04/13/2019) 180 tablet 0   No current facility-administered medications on file prior to visit.     Review of Systems  Constitutional: Positive for chills and malaise/fatigue. Negative for fever.  HENT: Positive for congestion and sore throat. Negative for ear pain and sinus pain.   Eyes: Negative for blurred vision, discharge and redness.  Respiratory: Positive for cough. Negative for sputum production, shortness of breath, wheezing and stridor.   Cardiovascular: Negative for chest pain, palpitations and leg swelling.  Gastrointestinal: Negative for abdominal pain, diarrhea, nausea and vomiting.  Musculoskeletal: Negative for myalgias.  Skin: Negative for rash.  Neurological: Negative for dizziness and headaches.     Observations/Objective: Patient appears well, in no distress but is fatigued  Weight is baseline  No facial swelling or asymmetry Voice is mildly hoars No obvious tremor or mobility impairment Moving neck and UEs normally Able to hear the call well  Dry cough heard during interview/ no sob or wheezing  Talkative and mentally sharp with no cognitive changes No skin changes on face or neck , no rash or pallor Affect is normal    Assessment and Plan: Problem List Items Addressed This Visit      Respiratory   Viral URI with cough - Primary    Cough/congestion/ST and headache with chills  inst re: getting tested for covid (likely at [facility]) now inst to isolate until result returns and update [place]  Fluids/rest Continue generic mucinex dm  Also ibuprofen prn  Work on smoking cessation  inst to update if symptoms worsen or change (go to the ER if sob)  She voiced understanding Work note written          Follow Up Instructions: Go get tested for covid now at [facility] ([facility])  Rest/drink fluids and isolate until result returns  Continue the mucous relief dm  Also ibuprofen as needed  Keep working on quitting smoking  If short of breath or suddenly worse -go to the ER  Otherwise update [place] if symptoms worsen or change    I discussed the assessment and treatment plan with the patient. The patient was provided an opportunity to ask questions and all were answered. The patient agreed with the plan and demonstrated an understanding of the instructions.   The patient was advised to call back or seek an in-person evaluation if the symptoms worsen or if the condition fails to improve as anticipated.     Loura Pardon, MD

## 2019-04-13 NOTE — Assessment & Plan Note (Signed)
Cough/congestion/ST and headache with chills  inst re: getting tested for covid (likely at [facility]) now inst to isolate until result returns and update [place]  Fluids/rest Continue generic mucinex dm  Also ibuprofen prn  Work on smoking cessation  inst to update if symptoms worsen or change (go to the ER if sob)  She voiced understanding Work note written

## 2019-04-13 NOTE — Patient Instructions (Signed)
Go get tested for covid now at [facility] (or [facility] the [facility] hot line)  Rest/drink fluids and isolate until result returns  Continue the mucous relief dm  Also ibuprofen as needed  Keep working on quitting smoking  If short of breath or suddenly worse -go to the ER  Otherwise update [place] if symptoms worsen or change

## 2019-04-15 LAB — NOVEL CORONAVIRUS, NAA: SARS-CoV-2, NAA: NOT DETECTED

## 2019-04-16 ENCOUNTER — Telehealth: Payer: Self-pay | Admitting: Primary Care

## 2019-04-16 MED ORDER — AZITHROMYCIN 250 MG PO TABS: ORAL_TABLET | ORAL | 0 refills | Status: DC

## 2019-04-16 NOTE — Telephone Encounter (Signed)
Pt had someone come pick up letter. The letter that was written states her symptoms but not that her test was negative. Pt is wondering if she can have a letter stating that her Covid test came back negative.

## 2019-04-16 NOTE — Telephone Encounter (Signed)
Pt notified letter ready for pick up. Since Covid test was negative I did advise pt it was negative. Pt said she is still dealing with a sever headache, she still has a cough, she has chest congestion where she feels like she need to cough something up but nothing comes up, and now she has developed a ST. Will route to Dr. Glori Bickers since she eval pt

## 2019-04-16 NOTE — Telephone Encounter (Signed)
I pended a px for zpak to take to cover for a bacterial infection like sinusitis/bronchitis Please send to pharmacy of choice  If symptoms continue to worsen she needs to go to [facility] for eval

## 2019-04-16 NOTE — Telephone Encounter (Signed)
Dr. Glori Bickers signed letter and pt notified it's ready for pick up

## 2019-04-16 NOTE — Telephone Encounter (Signed)
Placed on desk to sign

## 2019-04-16 NOTE — Telephone Encounter (Signed)
Patient saw Dr Glori Bickers not Allie Bossier for this

## 2019-04-16 NOTE — Telephone Encounter (Signed)
Pt notified of Dr. Tower's comments. Rx sent to pharmacy  

## 2019-04-16 NOTE — Telephone Encounter (Signed)
Pt said she is fine with Dr. Glori Bickers adding she can't return to work until she is feeling better in note

## 2019-04-16 NOTE — Telephone Encounter (Signed)
Please print letter and I will sign it

## 2019-04-16 NOTE — Telephone Encounter (Signed)
I still do not want her returning to work with those symptoms -until she feels better.  Does she want me to include that in the note?

## 2019-04-16 NOTE — Telephone Encounter (Signed)
Patient was seen by Dr Glori Bickers 12/4 virtually   She stated that she has not heard back about her covid test results and would like to know if we have received the results and could give her a call with those    Patient stated that she was also needing a work note done because she was taken out of work due to symptoms. She would like to know if this is ready so she can send someone to pick this up/

## 2019-04-16 NOTE — Telephone Encounter (Signed)
Pt called needing a note stating her covid test was negative.  Pt has someone picking up medical note for being out and wanted him to pick this up also.  She stated he will be in about 20 min

## 2019-04-16 NOTE — Telephone Encounter (Signed)
Addressed through separate phone note  

## 2019-04-25 ENCOUNTER — Encounter: Payer: Self-pay | Admitting: Primary Care

## 2019-04-25 ENCOUNTER — Ambulatory Visit (INDEPENDENT_AMBULATORY_CARE_PROVIDER_SITE_OTHER): Payer: BC Managed Care – PPO | Admitting: Primary Care

## 2019-04-25 ENCOUNTER — Telehealth: Payer: Self-pay | Admitting: Primary Care

## 2019-04-25 DIAGNOSIS — Z20822 Contact with and (suspected) exposure to covid-19: Secondary | ICD-10-CM

## 2019-04-25 DIAGNOSIS — Z20828 Contact with and (suspected) exposure to other viral communicable diseases: Secondary | ICD-10-CM | POA: Diagnosis not present

## 2019-04-25 NOTE — Assessment & Plan Note (Signed)
  Symptoms are suspicious for Covid-19, especially since she continues to have symptoms despite Azithromycin use. She appears ill but stable for outpatient treatment. We will have her return to the testing site for Covid-19 testing. Discussed home care instructions, continue to quarantine. Will provide work note for her to remain out.  Await results. Return precautions provided.

## 2019-04-25 NOTE — Telephone Encounter (Signed)
Noted  

## 2019-04-25 NOTE — Patient Instructions (Signed)
Go get Covid-19 tested now.   Remain in quarantine otherwise until we have your test result.  Covid-19 testing is by appointment only at this time.   You can schedule an appointment by going online to [email or web address], texting COVID to [phone], or by calling [phone].  Testing sites include:  [facility] ([address], [place], [place]) [facility] in [place], [place]  Please update me as discussed. Allie Bossier, NP-C

## 2019-04-25 NOTE — Telephone Encounter (Signed)
Pt called stating she is having covid test done 12/17 @ [facility]

## 2019-04-25 NOTE — Progress Notes (Signed)
Subjective:    Patient ID: Erika Brock, female    DOB: 1975-10-27, 43 y.o.   MRN: [id number]  HPI  Virtual Visit via Video Note  I connected with Erika Brock on 04/25/19 at 10:40 AM EST by a video enabled telemedicine application and verified that I am speaking with the correct person using two identifiers.  Location: Patient: Home Provider: Office   I discussed the limitations of evaluation and management by telemedicine and the availability of in person appointments. The patient expressed understanding and agreed to proceed.  History of Present Illness:  Ms. Cespedes is a 43 year old female with a history of GERD, anxiety and depression, PTSD who presents today with a chief complaint of cough.  She was treated on 04/13/19 by Dr. Milinda Antis for symptoms of cough/congestion/ST/headaches/chills. Tested for Covid-19 with negative test on 04/15/19. She was provided with azithromycin antibiotic on 04/16/19 to cover for potential bacterial cause.   Since treatment with azithromycin she's continued to experience cough, sore throat, post nasal drip, nasal mucous (yellow). She completed her antibiotic on 04/21/19, felt better initially but now feels worse.   Four days ago she developed a sore throat, fatigue, cough, chills, chest congestion, loss of taste smell, headaches. She's been to the grocery store six days ago, pet store four days ago. She's not checking her temperature. She's been home for the last four days, needs a work note to return to work. She doesn't feel well enough to return to work.    Observations/Objective:  Alert and oriented. Appears ill, stable.  No distress. Speaking in complete sentences. Persistent cough during visit, dry.   Assessment and Plan:  Symptoms are suspicious for Covid-19, especially since she continues to have symptoms despite Azithromycin use. She appears ill but stable for outpatient treatment. We will have her return to the testing site  for Covid-19 testing. Discussed home care instructions, continue to quarantine. Will provide work note for her to remain out.  Await results. Return precautions provided.   Follow Up Instructions:  Go get Covid-19 tested now.   Remain in quarantine otherwise until we have your test result.  Covid-19 testing is by appointment only at this time.   You can schedule an appointment by going online to [email or web address] texting [email or web address] to [phone], or by calling [phone].  Testing sites include:  [facility] ([address], [place], [place]) [facility] in [place], [place]  Please update me as discussed. Mayra Reel, NP-C    I discussed the assessment and treatment plan with the patient. The patient was provided an opportunity to ask questions and all were answered. The patient agreed with the plan and demonstrated an understanding of the instructions.   The patient was advised to call back or seek an in-person evaluation if the symptoms worsen or if the condition fails to improve as anticipated.   Doreene Nest, NP    Review of Systems  Constitutional: Positive for chills and fatigue.  HENT: Positive for congestion.   Respiratory: Positive for cough. Negative for shortness of breath.        Past Medical History:  Diagnosis Date  . Anemia    NOS  . Depression   . Headache(784.0)   . Migraine      Social History   Socioeconomic History  . Marital status: Married    Spouse name: Not on file  . Number of children: Not on file  . Years of education:  Not on file  . Highest education level: Not on file  Occupational History  . Not on file  Tobacco Use  . Smoking status: Current Every Day Smoker    Packs/day: 0.25    Years: 16.00    Pack years: 4.00    Types: Cigarettes  . Smokeless tobacco: Never Used  . Tobacco comment: smokes between 5-10 cigaretts a day  Substance and Sexual Activity  . Alcohol use: No    . Drug use: No  . Sexual activity: Not on file  Other Topics Concern  . Not on file  Social History Narrative   Lives with husband and 4 children in [place].  Moved from [place] 3 years ago.     Social Determinants of Health   Financial Resource Strain:   . Difficulty of Paying Living Expenses: Not on file  Food Insecurity:   . Worried About Charity fundraiser in the Last Year: Not on file  . Ran Out of Food in the Last Year: Not on file  Transportation Needs:   . Lack of Transportation (Medical): Not on file  . Lack of Transportation (Non-Medical): Not on file  Physical Activity:   . Days of Exercise per Week: Not on file  . Minutes of Exercise per Session: Not on file  Stress:   . Feeling of Stress : Not on file  Social Connections:   . Frequency of Communication with Friends and Family: Not on file  . Frequency of Social Gatherings with Friends and Family: Not on file  . Attends Religious Services: Not on file  . Active Member of Clubs or Organizations: Not on file  . Attends Archivist Meetings: Not on file  . Marital Status: Not on file  Intimate Partner Violence:   . Fear of Current or Ex-Partner: Not on file  . Emotionally Abused: Not on file  . Physically Abused: Not on file  . Sexually Abused: Not on file    Past Surgical History:  Procedure Laterality Date  . VAGINAL HYSTERECTOMY  2002   Prolapse, unsure if she still has ovaries    Family History  Problem Relation Age of Onset  . Diabetes Mother   . Bipolar disorder Mother   . Diabetes Father   . Diabetes Daughter   . ADD / ADHD Son   . Diabetes Brother     Allergies  Allergen Reactions  . Prozac [Fluoxetine Hcl] Swelling  . Penicillins Hives, Swelling and Other (See Comments)    REACTION: Severe reaction.    Current Outpatient Medications on File Prior to Visit  Medication Sig Dispense Refill  . acetaminophen (TYLENOL) 325 MG tablet Take 650 mg by mouth every 6 (six) hours as  needed for mild pain or fever.    . Ibuprofen (ADVIL MIGRAINE PO) Take 2 tablets by mouth daily as needed (headache).    . Multiple Vitamin (MULTIVITAMIN) tablet Take 1 tablet by mouth daily.    . famotidine (PEPCID) 20 MG tablet Take 1 tablet (20 mg total) by mouth 2 (two) times daily. (Patient not taking: Reported on 04/13/2019) 180 tablet 0   No current facility-administered medications on file prior to visit.    There were no vitals taken for this visit.   Objective:   Physical Exam  Constitutional: She is oriented to person, place, and time. She appears well-nourished.  Respiratory: Effort normal. No respiratory distress.  Cough during exam, frequent, dry  Neurological: She is alert and oriented to person, place, and  time.  Psychiatric: She has a normal mood and affect.           Assessment & Plan:

## 2019-04-26 ENCOUNTER — Ambulatory Visit: Payer: BC Managed Care – PPO | Attending: Internal Medicine

## 2019-04-26 ENCOUNTER — Other Ambulatory Visit: Payer: Self-pay

## 2019-04-26 DIAGNOSIS — Z20828 Contact with and (suspected) exposure to other viral communicable diseases: Secondary | ICD-10-CM | POA: Diagnosis not present

## 2019-04-26 DIAGNOSIS — Z20822 Contact with and (suspected) exposure to covid-19: Secondary | ICD-10-CM

## 2019-04-28 ENCOUNTER — Telehealth: Payer: Self-pay

## 2019-04-28 DIAGNOSIS — R05 Cough: Secondary | ICD-10-CM | POA: Diagnosis not present

## 2019-04-28 DIAGNOSIS — J209 Acute bronchitis, unspecified: Secondary | ICD-10-CM | POA: Diagnosis not present

## 2019-04-28 LAB — NOVEL CORONAVIRUS, NAA: SARS-CoV-2, NAA: NOT DETECTED

## 2019-04-28 NOTE — Telephone Encounter (Signed)
Team health fax received that pt is still having cough, drainage, headache, sore throat, chest pain, body aches, and diarrhea.  I tried to call the pt but no VM was available to leave a message. At this point, she is not showing that she went to a [facility] ER.

## 2019-04-30 NOTE — Telephone Encounter (Signed)
Noted  

## 2019-04-30 NOTE — Telephone Encounter (Signed)
[facility] Night - Client Nonclinical Telephone Record AccessNurse Client [facility] Night - Client Client Site [place] Physician Alma Friendly - NP Contact Type Call Who Is Calling Patient / Member / Family / Caregiver Caller Name Gonzales Phone Number [phone] Call Type Message Only Information Provided Reason for Call Returning a Call from the Office Initial [place] states that she got a call from this office. She is having chest pain and is on the way to the ER. She has already spoken to an after hours nurse. Additional Comment Office hours given. Disp. Time Disposition Final User 04/28/2019 10:13:37 AM General Information Provided Yes Vito Backers Call Closed By: Vito Backers Transaction Date/Time: 04/28/2019 10:09:51 AM (ET)

## 2019-04-30 NOTE — Telephone Encounter (Signed)
[facility] Night - Client TELEPHONE ADVICE RECORD AccessNurse Patient Name: Erika Brock Gender: Female DOB: June 23, 1975 Age: 43 Y 4 M 28 D Return Phone Number: [phone] (Primary) Address: City/State/Zip: [name] [address] Client [facility] Night - Client Client Site [facility] - Night Physician Vernona Rieger - NP Contact Type Call Who Is Calling Patient / Member / Family / Caregiver Call Type Triage / Clinical Relationship To Patient Self Return Phone Number [phone] (Primary) Chief Complaint CHEST PAIN (>=21 years) - pain, pressure, heaviness or tightness Reason for Call Symptomatic / Request for Health Information Initial Comment Caller states that she had to have a COVID-19 test done. It came back as no results and she does not understand what that means. She has a cough, drainage, headache, sore throat, and chest hurts, body aches, diarrhea. Translation No Nurse Assessment Nurse: Reinaldo Berber, RN, Adriana Date/Time (Eastern Time): 04/28/2019 8:20:44 AM Confirm and document reason for call. If symptomatic, describe symptoms. ---Caller c/o a cough, drainage, headache, sore throat, and chest hurts, body aches, diarrhea. Covid test on 12-4 and 12-17 negative. C/o chills, and intermittent chest pain. Has the patient had close contact with a person known or suspected to have the novel coronavirus illness OR traveled / lives in area with major community spread (including international travel) in the last 14 days from the onset of symptoms? * If Asymptomatic, screen for exposure and travel within the last 14 days. ---No Does the patient have any new or worsening symptoms? ---Yes Will a triage be completed? ---Yes Related visit to physician within the last 2 weeks? ---No Does the PT have any chronic conditions? (i.e. diabetes, asthma, this includes High risk factors for pregnancy, etc.) ---No Is  the patient pregnant or possibly pregnant? (Ask all females between the ages of 76-55) ---No Is this a behavioral health or substance abuse call? ---No Guidelines Guideline Title Affirmed Question Affirmed Notes Nurse Date/Time (Eastern Time) Coronavirus (COVID-19) - Diagnosed or Suspected MILD difficulty breathing (e.g., minimal/no SOB at [place], [place], Ricki Rodriguez 04/28/2019 8:23:07 AM PLEASE NOTE: All timestamps contained within this report are represented as [place] Standard Time. CONFIDENTIALTY NOTICE: This fax transmission is intended only for the addressee. It contains information that is legally privileged, confidential or otherwise protected from use or disclosure. If you are not the intended recipient, you are strictly prohibited from reviewing, disclosing, copying using or disseminating any of this information or taking any action in reliance on or regarding this information. If you have received this fax in error, please notify us immediately by telephone so that we can arrange for its return to [place]. Phone: [phone], Toll-Free: [phone], Fax: [phone] Page: 2 of 2 Call Id: [id number] Guidelines Guideline Title Affirmed Question Affirmed Notes Nurse Date/Time [name] Time) rest, SOB with walking, pulse <100) Disp. Time [name] Time) Disposition Final User 04/28/2019 8:18:45 AM Send to Urgent Queue Edmonia Lynch 04/28/2019 8:28:27 AM Go to ED Now (or PCP triage) Yes Reinaldo Berber, RN, Carlton Adam Disagree/Comply Comply Caller Understands Yes PreDisposition Call Doctor Care Advice Given Per Guideline GO TO ED NOW (OR PCP TRIAGE): * IF NO PCP (PRIMARY CARE PROVIDER) SECOND-LEVEL TRIAGE: You need to be seen within the next hour. Go to the ED/UCC at _____________ Hospital. Leave as soon as you can. GENERAL CARE ADVICE FOR COVID-19 SYMPTOMS: * The treatment is the same whether you have COVID-19, influenza or some other respiratory virus. * Cough: Use cough drops. *  Feeling dehydrated: Drink extra  liquids. If the air in your home is dry, use a humidifier. * Muscle aches, headache, and other pains: Often this comes and goes with the fever. Take acetaminophen every 4-6 hours (Adults 650 mg) OR ibuprofen every 6-8 hours (Adults 400 mg). Before taking any medicine, read all the instructions on the package. * Sore throat: Try throat lozenges, hard candy or warm chicken broth. CARE ADVICE given per CORONAVIRUS (COVID-19) - DIAGNOSED OR SUSPECTED (Adult) guideline. Referrals [facility]

## 2019-04-30 NOTE — Telephone Encounter (Signed)
I spoke with pt; and pt went to [facility] in [place]; pt has bronchitis and sinus infection; pt was given doxycycline and prednisone. Pt is feeling better with not being out of breath when trying to breathe. Pt will cb if needed. FYI to Gentry Fitz NP.

## 2019-06-21 ENCOUNTER — Ambulatory Visit (INDEPENDENT_AMBULATORY_CARE_PROVIDER_SITE_OTHER): Payer: BC Managed Care – PPO | Admitting: Primary Care

## 2019-06-21 ENCOUNTER — Encounter: Payer: Self-pay | Admitting: Primary Care

## 2019-06-21 DIAGNOSIS — J069 Acute upper respiratory infection, unspecified: Secondary | ICD-10-CM | POA: Insufficient documentation

## 2019-06-21 NOTE — Patient Instructions (Signed)
Your symptoms are representative of a viral illness which will resolve on its own over time. Our goal is to treat your symptoms in order to aid your body in the healing process and to make you more comfortable.   I do recommend Covid-19 testing as discussed.  You may not return to work unless your symptoms have nearly resolved and you've been without fevers for 24 hours without Advil/Tylenol.  Try to get some rest, update me tomorrow as discussed.  It was a pleasure to see you today!Mayra Reel, NP-C

## 2019-06-21 NOTE — Assessment & Plan Note (Signed)
Do suspect viral etiology, cannot completely rule out Covid-19 given symptoms, but it does appear that she and her employer are abiding by CDC guidelines.   I recommended she go for Covid-19 testing, she refuses as she will be put out of work for two weeks without pay. I discussed that she cannot go to work with any fever. She can return to work once feeling better and without fever.  Work note provided. She will update.

## 2019-06-21 NOTE — Progress Notes (Signed)
Subjective:    Patient ID: Erika Brock, female    DOB: 1976/01/06, 44 y.o.   MRN: [id number]  HPI  Virtual Visit via Video Note  I connected with Erika Brock on 06/21/19 at 10:20 AM EST by a video enabled telemedicine application and verified that I am speaking with the correct person using two identifiers.  Location: Patient: Home Provider: Office   I discussed the limitations of evaluation and management by telemedicine and the availability of in person appointments. The patient expressed understanding and agreed to proceed.  History of Present Illness:  Erika Brock is a 44 year old female with a history of GERD who presents today with a chief complaint of cough.  Her symptoms began with sore throat, then progressed to rhinorrhea, cough, fatigue, headaches. Symptoms began about five days ago, today she's feeling about the same, low energy. This morning she started with diarrhea, one episode.   She's been working a lot of overtime due to shortages at work, thinks she's just worn out. She wears a mask at work along with her co-workers, Therapist, nutritional. No gatherings outside of work.  She denies fevers, loss of taste/smell, nausea. Her last night at work was Tuesday night. She's taken Sudafed, Advil, Mucinex, Halls, hot tea with lemon.   Observations/Objective:  Alert and oriented. Appears ill, stable. Appears tired. No distress. Speaking in complete sentences. Dry cough during exam  Assessment and Plan:  Do suspect viral etiology, cannot completely rule out Covid-19 given symptoms, but it does appear that she and her employer are abiding by CDC guidelines.   I recommended she go for Covid-19 testing, she refuses as she will be put out of work for two weeks without pay. I discussed that she cannot go to work with any fever. She can return to work once feeling better and without fever.  Work note provided. She will update.   Follow Up  Instructions:  Your symptoms are representative of a viral illness which will resolve on its own over time. Our goal is to treat your symptoms in order to aid your body in the healing process and to make you more comfortable.   I do recommend Covid-19 testing as discussed.  You may not return to work unless your symptoms have nearly resolved and you've been without fevers for 24 hours without Advil/Tylenol.  Try to get some rest, update me tomorrow as discussed.  It was a pleasure to see you today!Erika Bossier, NP-C    I discussed the assessment and treatment plan with the patient. The patient was provided an opportunity to ask questions and all were answered. The patient agreed with the plan and demonstrated an understanding of the instructions.   The patient was advised to call back or seek an in-person evaluation if the symptoms worsen or if the condition fails to improve as anticipated.    Erika Koch, NP    Review of Systems  Constitutional: Positive for fatigue. Negative for fever.  HENT: Positive for congestion, postnasal drip and sore throat. Negative for ear pain.   Respiratory: Positive for cough.   Gastrointestinal: Positive for diarrhea.  Neurological: Positive for headaches.       Past Medical History:  Diagnosis Date  . Anemia    NOS  . Depression   . Headache(784.0)   . Migraine      Social History   Socioeconomic History  . Marital status: Married    Spouse name: Not on file  .  Number of children: Not on file  . Years of education: Not on file  . Highest education level: Not on file  Occupational History  . Not on file  Tobacco Use  . Smoking status: Current Every Day Smoker    Packs/day: 0.25    Years: 16.00    Pack years: 4.00    Types: Cigarettes  . Smokeless tobacco: Never Used  . Tobacco comment: smokes between 5-10 cigaretts a day  Substance and Sexual Activity  . Alcohol use: No  . Drug use: No  . Sexual activity: Not on file   Other Topics Concern  . Not on file  Social History Narrative   Lives with husband and 4 children in [place].  Moved from [place] 3 years ago.     Social Determinants of Health   Financial Resource Strain:   . Difficulty of Paying Living Expenses: Not on file  Food Insecurity:   . Worried About Programme researcher, broadcasting/film/video in the Last Year: Not on file  . Ran Out of Food in the Last Year: Not on file  Transportation Needs:   . Lack of Transportation (Medical): Not on file  . Lack of Transportation (Non-Medical): Not on file  Physical Activity:   . Days of Exercise per Week: Not on file  . Minutes of Exercise per Session: Not on file  Stress:   . Feeling of Stress : Not on file  Social Connections:   . Frequency of Communication with Friends and Family: Not on file  . Frequency of Social Gatherings with Friends and Family: Not on file  . Attends Religious Services: Not on file  . Active Member of Clubs or Organizations: Not on file  . Attends Banker Meetings: Not on file  . Marital Status: Not on file  Intimate Partner Violence:   . Fear of Current or Ex-Partner: Not on file  . Emotionally Abused: Not on file  . Physically Abused: Not on file  . Sexually Abused: Not on file    Past Surgical History:  Procedure Laterality Date  . VAGINAL HYSTERECTOMY  2002   Prolapse, unsure if she still has ovaries    Family History  Problem Relation Age of Onset  . Diabetes Mother   . Bipolar disorder Mother   . Diabetes Father   . Diabetes Daughter   . ADD / ADHD Son   . Diabetes Brother     Allergies  Allergen Reactions  . Prozac [Fluoxetine Hcl] Swelling  . Penicillins Hives, Swelling and Other (See Comments)    REACTION: Severe reaction.    Current Outpatient Medications on File Prior to Visit  Medication Sig Dispense Refill  . acetaminophen (TYLENOL) 325 MG tablet Take 650 mg by mouth every 6 (six) hours as needed for mild pain or fever.    . Ibuprofen  (ADVIL MIGRAINE PO) Take 2 tablets by mouth daily as needed (headache).    . Multiple Vitamin (MULTIVITAMIN) tablet Take 1 tablet by mouth daily.    . famotidine (PEPCID) 20 MG tablet Take 1 tablet (20 mg total) by mouth 2 (two) times daily. (Patient not taking: Reported on 04/13/2019) 180 tablet 0   No current facility-administered medications on file prior to visit.    Temp 99.1 F (37.3 C) (Temporal)   Wt 128 lb (58.1 kg)   BMI 23.79 kg/m    Objective:   Physical Exam  Constitutional: She is oriented to person, place, and time. She appears well-nourished. She appears ill.  Respiratory: Effort normal.  Dry cough at times during exam  Neurological: She is alert and oriented to person, place, and time.           Assessment & Plan:

## 2020-03-28 ENCOUNTER — Other Ambulatory Visit (INDEPENDENT_AMBULATORY_CARE_PROVIDER_SITE_OTHER): Payer: 59

## 2020-03-28 ENCOUNTER — Telehealth (INDEPENDENT_AMBULATORY_CARE_PROVIDER_SITE_OTHER): Payer: 59 | Admitting: Primary Care

## 2020-03-28 ENCOUNTER — Other Ambulatory Visit: Payer: 59

## 2020-03-28 ENCOUNTER — Encounter: Payer: Self-pay | Admitting: Primary Care

## 2020-03-28 ENCOUNTER — Other Ambulatory Visit: Payer: Self-pay

## 2020-03-28 DIAGNOSIS — R509 Fever, unspecified: Secondary | ICD-10-CM

## 2020-03-28 DIAGNOSIS — J069 Acute upper respiratory infection, unspecified: Secondary | ICD-10-CM | POA: Diagnosis not present

## 2020-03-28 DIAGNOSIS — R059 Cough, unspecified: Secondary | ICD-10-CM

## 2020-03-28 LAB — POCT RAPID STREP A (OFFICE): Rapid Strep A Screen: NEGATIVE

## 2020-03-28 MED ORDER — FLUTICASONE PROPIONATE 50 MCG/ACT NA SUSP: 1.0000 | Freq: Two times a day (BID) | NASAL | 0 refills | Status: DC

## 2020-03-28 MED ORDER — CETIRIZINE HCL 10 MG PO TABS: 10.0000 mg | ORAL_TABLET | Freq: Every day | ORAL | 0 refills | Status: DC

## 2020-03-28 NOTE — Progress Notes (Signed)
Subjective:    Patient ID: Erika Brock, female    DOB: [birth year], 44 y.o.   MRN: [id number]  HPI  Virtual Visit via Video Note  I connected with Erika Brock on 03/28/20 at  7:40 AM EST by a video enabled telemedicine application and verified that I am speaking with the correct person using two identifiers.  Location: Patient: Home Provider: Office   I discussed the limitations of evaluation and management by telemedicine and the availability of in person appointments. The patient expressed understanding and agreed to proceed.  History of Present Illness:  Ms. Nesbitt is a 44 year old female with a history of URI, GERD, anemia who presents today with a chief complaint of headache.  Symptoms began three days ago with a headache. Yesterday she woke up with a sore throat and low grade fever of 99. She also reports post nasal drip, rhinorrhea, swollen and red throat, mild cough from the drainage.  She's been taking Ibuprofen with a warm shower with temporary improvement.  She denies loss of taste/smell, diarrhea. She works in a Holiday representative. She has not been vaccinated against Covid-19.   Observations/Objective:  Alert and oriented. Appears tired but not sickly. No distress. Speaking in complete sentences. Dry cough during exam.  Assessment and Plan:  Acute URI symptoms x 3 days, cannot rule out Covid-19 at this point as she is unvaccinated and works in a Recruitment consultant around numerous people daily.  Suspect viral etiology. Will have her tested for Covid-19 today. Will also swab throat for strep.  Discussed conservative treatment such as Flonase and antihistamine.  She will update next week. Work note provided for this weekend.   Follow Up Instructions:  Nasal Congestion/Ear Pressure/Sinus Pressure: Try using Flonase (fluticasone) nasal spray. Instill 1 spray in each nostril twice daily.   Start Zyrtec at bedtime to help with runny nose, throat  drainage, sneezing.  We will call you regarding when to come in this afternoon for testing.  Please update me early next week if no improvement.  It was a pleasure to see you today! Mayra Reel, NP-C    I discussed the assessment and treatment plan with the patient. The patient was provided an opportunity to ask questions and all were answered. The patient agreed with the plan and demonstrated an understanding of the instructions.   The patient was advised to call back or seek an in-person evaluation if the symptoms worsen or if the condition fails to improve as anticipated.    Doreene Nest, NP    Review of Systems  Constitutional: Positive for fatigue and fever. Negative for chills.  HENT: Positive for congestion, postnasal drip, sinus pressure and sore throat.   Respiratory: Positive for cough. Negative for shortness of breath.   Allergic/Immunologic: Positive for environmental allergies.  Neurological: Positive for headaches.        Past Medical History:  Diagnosis Date  . Anemia    NOS  . Depression   . Headache(784.0)   . Migraine      Social History   Socioeconomic History  . Marital status: Married    Spouse name: Not on file  . Number of children: Not on file  . Years of education: Not on file  . Highest education level: Not on file  Occupational History  . Not on file  Tobacco Use  . Smoking status: Current Every Day Smoker    Packs/day: 0.25    Years: 16.00  Pack years: 4.00    Types: Cigarettes  . Smokeless tobacco: Never Used  . Tobacco comment: smokes between 5-10 cigaretts a day  Substance and Sexual Activity  . Alcohol use: No  . Drug use: No  . Sexual activity: Not on file  Other Topics Concern  . Not on file  Social History Narrative   Lives with husband and 4 children in [place].  Moved from [place] 3 years ago.     Social Determinants of Health   Financial Resource Strain:   . Difficulty of Paying Living Expenses: Not  on file  Food Insecurity:   . Worried About Programme researcher, broadcasting/film/video in the Last Year: Not on file  . Ran Out of Food in the Last Year: Not on file  Transportation Needs:   . Lack of Transportation (Medical): Not on file  . Lack of Transportation (Non-Medical): Not on file  Physical Activity:   . Days of Exercise per Week: Not on file  . Minutes of Exercise per Session: Not on file  Stress:   . Feeling of Stress : Not on file  Social Connections:   . Frequency of Communication with Friends and Family: Not on file  . Frequency of Social Gatherings with Friends and Family: Not on file  . Attends Religious Services: Not on file  . Active Member of Clubs or Organizations: Not on file  . Attends Banker Meetings: Not on file  . Marital Status: Not on file  Intimate Partner Violence:   . Fear of Current or Ex-Partner: Not on file  . Emotionally Abused: Not on file  . Physically Abused: Not on file  . Sexually Abused: Not on file    Past Surgical History:  Procedure Laterality Date  . VAGINAL HYSTERECTOMY  2002   Prolapse, unsure if she still has ovaries    Family History  Problem Relation Age of Onset  . Diabetes Mother   . Bipolar disorder Mother   . Diabetes Father   . Diabetes Daughter   . ADD / ADHD Son   . Diabetes Brother     Allergies  Allergen Reactions  . Prozac [Fluoxetine Hcl] Swelling  . Penicillins Hives, Swelling and Other (See Comments)    REACTION: Severe reaction.    Current Outpatient Medications on File Prior to Visit  Medication Sig Dispense Refill  . Ibuprofen (ADVIL MIGRAINE PO) Take 2 tablets by mouth daily as needed (headache).    . Multiple Vitamin (MULTIVITAMIN) tablet Take 1 tablet by mouth daily.    . famotidine (PEPCID) 20 MG tablet Take 1 tablet (20 mg total) by mouth 2 (two) times daily. (Patient not taking: Reported on 03/28/2020) 180 tablet 0   No current facility-administered medications on file prior to visit.    Ht 5'  1.5" (1.562 m)   Wt 134 lb (60.8 kg)   BMI 24.91 kg/m    Objective:   Physical Exam Constitutional:      General: She is not in acute distress.    Appearance: She is not ill-appearing.  Pulmonary:     Effort: Pulmonary effort is normal.     Comments: Dry cough noted a few times during visit. Neurological:     Mental Status: She is alert and oriented to person, place, and time.            Assessment & Plan:

## 2020-03-28 NOTE — Patient Instructions (Signed)
Nasal Congestion/Ear Pressure/Sinus Pressure: Try using Flonase (fluticasone) nasal spray. Instill 1 spray in each nostril twice daily.   Start Zyrtec at bedtime to help with runny nose, throat drainage, sneezing.  I will send both medications to your pharmacy as a prescription.  We will call you regarding when to come in this afternoon for testing.  Please update me early next week if no improvement.  It was a pleasure to see you today! Mayra Reel, NP-C

## 2020-03-28 NOTE — Assessment & Plan Note (Signed)
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  Suspect viral etiology. Will have her tested for Covid-19 today. Will also swab throat for strep.  Discussed conservative treatment such as Flonase and antihistamine.  She will update next week. Work note provided for this weekend.

## 2020-03-30 LAB — NOVEL CORONAVIRUS, NAA: SARS-CoV-2, NAA: NOT DETECTED

## 2020-03-30 LAB — SPECIMEN STATUS REPORT

## 2020-03-30 LAB — SARS-COV-2, NAA 2 DAY TAT

## 2020-03-31 ENCOUNTER — Telehealth: Payer: Self-pay | Admitting: *Deleted

## 2020-03-31 DIAGNOSIS — R509 Fever, unspecified: Secondary | ICD-10-CM

## 2020-03-31 DIAGNOSIS — R059 Cough, unspecified: Secondary | ICD-10-CM

## 2020-03-31 NOTE — Telephone Encounter (Signed)
I recommend she proceed for repeat Covid-19 testing given her newer symptoms.  If she tests negative and does not feel well then we need to obtain a chest xray.  Any fevers?

## 2020-03-31 NOTE — Telephone Encounter (Signed)
Patient called stating that she wanted to report some new symptoms to Mayra Reel NP. Patient stated that she still has a headache, chills, sweats, sore throat and her taste is off a little. Patient stated the problem with her taste could be from the drainage that she is having. Patient stated that her throat is still sore. Patient stated that she started Sunday early am with nausea and diarrhea. Patient denies any SOB or difficulty breathing. Patient stated that she started feeling bad Tuesday.

## 2020-03-31 NOTE — Telephone Encounter (Signed)
Called patient app made for testing. She has not had any fever but had had chills. Please put updated letter in my chart for work tonight. Let me know when done I will put order for testing in

## 2020-03-31 NOTE — Telephone Encounter (Signed)
Work note updated and sent. Lab orders for COVID-19 testing placed.

## 2020-04-01 ENCOUNTER — Other Ambulatory Visit: Payer: 59

## 2020-04-01 DIAGNOSIS — R059 Cough, unspecified: Secondary | ICD-10-CM

## 2020-04-02 NOTE — Telephone Encounter (Signed)
Vm not set up

## 2020-04-02 NOTE — Telephone Encounter (Signed)
Patient declined medications at this point. Reviewed red words with her and if she start having any she will go to  ED. Let know we will call as soon as results received.

## 2020-04-02 NOTE — Telephone Encounter (Signed)
Noted, await Covid-19 test results.

## 2020-04-02 NOTE — Telephone Encounter (Signed)
It looks like her COVID-19 test is still pending. I can send in some antinausea medicine, but otherwise we need to wait for her test result. If she continues to feel worse than she needs to go to the emergency department for evaluation. Let me know.

## 2020-04-02 NOTE — Telephone Encounter (Signed)
Patient is returning call to [place]. Please call back. Best phone # [phone]

## 2020-04-02 NOTE — Telephone Encounter (Signed)
Patient left a voicemail stating that her symptoms are getting worse. Patient stated she is is really nauseated. Patient wants to know what she should do?  Called patient back and was advised that she has a severe headache and nausea. Patient stated that she took two advil and it is not touching the pain.  Patient denies any SOB or difficulty breathing. Patient stated that she is having chills and sweating a lot, but no fever.. Patient stated that she has a dry cough.  Pharmacy [facility]

## 2020-04-03 LAB — NOVEL CORONAVIRUS, NAA: SARS-CoV-2, NAA: NOT DETECTED

## 2020-04-03 LAB — SARS-COV-2, NAA 2 DAY TAT

## 2020-04-03 LAB — SPECIMEN STATUS REPORT

## 2020-04-14 ENCOUNTER — Other Ambulatory Visit: Payer: 59

## 2020-04-22 ENCOUNTER — Ambulatory Visit (INDEPENDENT_AMBULATORY_CARE_PROVIDER_SITE_OTHER): Payer: 59 | Admitting: Primary Care

## 2020-04-22 ENCOUNTER — Encounter: Payer: Self-pay | Admitting: Primary Care

## 2020-04-22 ENCOUNTER — Other Ambulatory Visit: Payer: Self-pay

## 2020-04-22 VITALS — BP 126/78 | HR 88 | Temp 97.6°F | Ht 65.1 in | Wt 137.0 lb

## 2020-04-22 DIAGNOSIS — F418 Other specified anxiety disorders: Secondary | ICD-10-CM

## 2020-04-22 DIAGNOSIS — Z0000 Encounter for general adult medical examination without abnormal findings: Secondary | ICD-10-CM | POA: Diagnosis not present

## 2020-04-22 DIAGNOSIS — Z1159 Encounter for screening for other viral diseases: Secondary | ICD-10-CM | POA: Diagnosis not present

## 2020-04-22 DIAGNOSIS — Z1231 Encounter for screening mammogram for malignant neoplasm of breast: Secondary | ICD-10-CM | POA: Diagnosis not present

## 2020-04-22 DIAGNOSIS — K219 Gastro-esophageal reflux disease without esophagitis: Secondary | ICD-10-CM

## 2020-04-22 DIAGNOSIS — Z23 Encounter for immunization: Secondary | ICD-10-CM

## 2020-04-22 DIAGNOSIS — M79603 Pain in arm, unspecified: Secondary | ICD-10-CM | POA: Insufficient documentation

## 2020-04-22 DIAGNOSIS — Z1211 Encounter for screening for malignant neoplasm of colon: Secondary | ICD-10-CM

## 2020-04-22 DIAGNOSIS — Z114 Encounter for screening for human immunodeficiency virus [HIV]: Secondary | ICD-10-CM | POA: Diagnosis not present

## 2020-04-22 DIAGNOSIS — M79601 Pain in right arm: Secondary | ICD-10-CM

## 2020-04-22 NOTE — Progress Notes (Signed)
Subjective:    Patient ID: Erika Brock, female    DOB: 1976-05-10, 44 y.o.   MRN: [id number]  HPI  This visit occurred during the SARS-CoV-2 public health emergency.  Safety protocols were in place, including screening questions prior to the visit, additional usage of staff PPE, and extensive cleaning of exam room while observing appropriate contact time as indicated for disinfecting solutions.   Erika Brock is a 44 year old female who presents today for complete physical.  She would also like to discuss right elbow pain. Acute for the last two months, has also noticed a reduced grip to her right hand, does drop objects at times, sometimes numbness to hands. She works in a [employer] and does a lot of repetitive movement at work. She's not tired wearing a brace or taking anything for symptoms.    Immunizations: -Tetanus: Completed in 2010, due -Influenza: Declines  -Covid-19: Has not completed   Diet: She has improved her diet, is trying to reduce fast/fried food. Exercise: She is active at work, no regular exercise.   Eye exam: Completed in 2021 Dental exam: No recent exam  Pap Smear: Hysterectomy  Mammogram: No recent mammogram  Colonoscopy: Never completed   BP Readings from Last 3 Encounters:  04/22/20 126/78  10/13/17 120/70  10/07/17 117/78     Review of Systems  Constitutional: Negative for unexpected weight change.  HENT: Negative for rhinorrhea.   Eyes: Negative for visual disturbance.  Respiratory: Negative for cough and shortness of breath.   Cardiovascular: Negative for chest pain.  Gastrointestinal: Negative for constipation and diarrhea.  Genitourinary: Negative for difficulty urinating.  Musculoskeletal: Negative for arthralgias and myalgias.  Skin: Negative for rash.  Allergic/Immunologic: Negative for environmental allergies.  Neurological: Positive for numbness. Negative for dizziness and headaches.  Psychiatric/Behavioral: The patient is not  nervous/anxious.        Past Medical History:  Diagnosis Date  . Anemia    NOS  . Depression   . Headache(784.0)   . Migraine      Social History   Socioeconomic History  . Marital status: Married    Spouse name: Not on file  . Number of children: Not on file  . Years of education: Not on file  . Highest education level: Not on file  Occupational History  . Not on file  Tobacco Use  . Smoking status: Current Every Day Smoker    Packs/day: 0.25    Years: 16.00    Pack years: 4.00    Types: Cigarettes  . Smokeless tobacco: Never Used  . Tobacco comment: smokes between 5-10 cigaretts a day  Substance and Sexual Activity  . Alcohol use: No  . Drug use: No  . Sexual activity: Not on file  Other Topics Concern  . Not on file  Social History Narrative   Lives with husband and 4 children in [place].  Moved from [place] 3 years ago.     Social Determinants of Health   Financial Resource Strain: Not on file  Food Insecurity: Not on file  Transportation Needs: Not on file  Physical Activity: Not on file  Stress: Not on file  Social Connections: Not on file  Intimate Partner Violence: Not on file    Past Surgical History:  Procedure Laterality Date  . VAGINAL HYSTERECTOMY  2002   Prolapse, unsure if she still has ovaries    Family History  Problem Relation Age of Onset  . Diabetes Mother   . Bipolar  disorder Mother   . Diabetes Father   . Diabetes Daughter   . ADD / ADHD Son   . Diabetes Brother     Allergies  Allergen Reactions  . Prozac [Fluoxetine Hcl] Swelling  . Penicillins Hives, Swelling and Other (See Comments)    REACTION: Severe reaction.    Current Outpatient Medications on File Prior to Visit  Medication Sig Dispense Refill  . cetirizine (ZYRTEC) 10 MG tablet Take 1 tablet (10 mg total) by mouth at bedtime. For allergies. 30 tablet 0  . fluticasone (FLONASE) 50 MCG/ACT nasal spray Place 1 spray into both nostrils 2 (two) times daily.  16 g 0  . Ibuprofen (ADVIL MIGRAINE PO) Take 2 tablets by mouth daily as needed (headache).    . Multiple Vitamin (MULTIVITAMIN) tablet Take 1 tablet by mouth daily.     No current facility-administered medications on file prior to visit.    BP 126/78   Pulse 88   Temp 97.6 F (36.4 C) (Temporal)   Ht 5' 5.1" (1.654 m)   Wt 137 lb (62.1 kg)   SpO2 98%   BMI 22.73 kg/m    Objective:   Physical Exam Constitutional:      Appearance: She is well-nourished.  HENT:     Right Ear: Tympanic membrane and ear canal normal.     Left Ear: Tympanic membrane and ear canal normal.     Mouth/Throat:     Mouth: Oropharynx is clear and moist.  Eyes:     Extraocular Movements: EOM normal.     Pupils: Pupils are equal, round, and reactive to light.  Cardiovascular:     Rate and Rhythm: Normal rate and regular rhythm.  Pulmonary:     Effort: Pulmonary effort is normal.     Breath sounds: Normal breath sounds.  Abdominal:     General: Bowel sounds are normal.     Palpations: Abdomen is soft.     Tenderness: There is no abdominal tenderness.  Musculoskeletal:        General: Normal range of motion.     Cervical back: Neck supple.  Skin:    General: Skin is warm and dry.  Neurological:     Mental Status: She is alert and oriented to person, place, and time.     Cranial Nerves: No cranial nerve deficit.     Deep Tendon Reflexes:     Reflex Scores:      Patellar reflexes are 2+ on the right side and 2+ on the left side. Psychiatric:        Mood and Affect: Mood and affect and mood normal.            Assessment & Plan:

## 2020-04-22 NOTE — Patient Instructions (Addendum)
[facility] to schedule your mammogram.   Stop by the lab prior to leaving today. I will notify you of your results once received.   Start exercising. You should be getting 150 minutes of moderate intensity exercise weekly.  It's important to improve your diet by reducing consumption of fast food, fried food, processed snack foods, sugary drinks. Increase consumption of fresh vegetables and fruits, whole grains, water.  Ensure you are drinking 64 ounces of water daily.  It was a pleasure to see you today!   Preventive Care 13-44 Years Old, Female Preventive care refers to visits with your health care provider and lifestyle choices that can promote health and wellness. This includes:  A yearly physical exam. This may also be called an annual well check.  Regular dental visits and eye exams.  Immunizations.  Screening for certain conditions.  Healthy lifestyle choices, such as eating a healthy diet, getting regular exercise, not using drugs or products that contain nicotine and tobacco, and limiting alcohol use. What can I expect for my preventive care visit? Physical exam Your health care provider will check your:  Height and weight. This may be used to calculate body mass index (BMI), which tells if you are at a healthy weight.  Heart rate and blood pressure.  Skin for abnormal spots. Counseling Your health care provider may ask you questions about your:  Alcohol, tobacco, and drug use.  Emotional well-being.  Home and relationship well-being.  Sexual activity.  Eating habits.  Work and work Statistician.  Method of birth control.  Menstrual cycle.  Pregnancy history. What immunizations do I need?  Influenza (flu) vaccine  This is recommended every year. Tetanus, diphtheria, and pertussis (Tdap) vaccine  You may need a Td booster every 10 years. Varicella (chickenpox) vaccine  You may need this if you have not been vaccinated. Zoster  (shingles) vaccine  You may need this after age 77. Measles, mumps, and rubella (MMR) vaccine  You may need at least one dose of MMR if you were born in 1957 or later. You may also need a second dose. Pneumococcal conjugate (PCV13) vaccine  You may need this if you have certain conditions and were not previously vaccinated. Pneumococcal polysaccharide (PPSV23) vaccine  You may need one or two doses if you smoke cigarettes or if you have certain conditions. Meningococcal conjugate (MenACWY) vaccine  You may need this if you have certain conditions. Hepatitis A vaccine  You may need this if you have certain conditions or if you travel or work in places where you may be exposed to hepatitis A. Hepatitis B vaccine  You may need this if you have certain conditions or if you travel or work in places where you may be exposed to hepatitis B. Haemophilus influenzae type b (Hib) vaccine  You may need this if you have certain conditions. Human papillomavirus (HPV) vaccine  If recommended by your health care provider, you may need three doses over 6 months. You may receive vaccines as individual doses or as more than one vaccine together in one shot (combination vaccines). Talk with your health care provider about the risks and benefits of combination vaccines. What tests do I need? Blood tests  Lipid and cholesterol levels. These may be checked every 5 years, or more frequently if you are over 73 years old.  Hepatitis C test.  Hepatitis B test. Screening  Lung cancer screening. You may have this screening every year starting at age 33 if you have  a 30-pack-year history of smoking and currently smoke or have quit within the past 15 years.  Colorectal cancer screening. All adults should have this screening starting at age 57 and continuing until age 22. Your health care provider may recommend screening at age 60 if you are at increased risk. You will have tests every 1-10 years, depending  on your results and the type of screening test.  Diabetes screening. This is done by checking your blood sugar (glucose) after you have not eaten for a while (fasting). You may have this done every 1-3 years.  Mammogram. This may be done every 1-2 years. Talk with your health care provider about when you should start having regular mammograms. This may depend on whether you have a family history of breast cancer.  BRCA-related cancer screening. This may be done if you have a family history of breast, ovarian, tubal, or peritoneal cancers.  Pelvic exam and Pap test. This may be done every 3 years starting at age 31. Starting at age 62, this may be done every 5 years if you have a Pap test in combination with an HPV test. Other tests  Sexually transmitted disease (STD) testing.  Bone density scan. This is done to screen for osteoporosis. You may have this scan if you are at high risk for osteoporosis. Follow these instructions at home: Eating and drinking  Eat a diet that includes fresh fruits and vegetables, whole grains, lean protein, and low-fat dairy.  Take vitamin and mineral supplements as recommended by your health care provider.  Do not drink alcohol if: ? Your health care provider tells you not to drink. ? You are pregnant, may be pregnant, or are planning to become pregnant.  If you drink alcohol: ? Limit how much you have to 0-1 drink a day. ? Be aware of how much alcohol is in your drink. In the [place]., one drink equals one 12 oz bottle of beer (355 mL), one 5 oz glass of wine (148 mL), or one 1 oz glass of hard liquor (44 mL). Lifestyle  Take daily care of your teeth and gums.  Stay active. Exercise for at least 30 minutes on 5 or more days each week.  Do not use any products that contain nicotine or tobacco, such as cigarettes, e-cigarettes, and chewing tobacco. If you need help quitting, ask your health care provider.  If you are sexually active, practice safe sex. Use  a condom or other form of birth control (contraception) in order to prevent pregnancy and STIs (sexually transmitted infections).  If told by your health care provider, take low-dose aspirin daily starting at age 12. What's next?  Visit your health care provider once a year for a well check visit.  Ask your health care provider how often you should have your eyes and teeth checked.  Stay up to date on all vaccines. This information is not intended to replace advice given to you by your health care provider. Make sure you discuss any questions you have with your health care provider. Document Revised: 01/05/2018 Document Reviewed: 01/05/2018 Elsevier Patient Education  [place].    []   2D Mammogram  [x]   3D Mammogram  []   Bone Density   Call for appointment   Your appointment will at the following location  []   [facility]  [address] [place] [address]  [phone]  []   [place] [facility] (  [facility])   [address] [place], [place] [address]  [phone]  [x]   [facility]      [address] [place], [place]         []   [facility]  [place], [place]  []  [facility]   [address], [place] [address]  []  [facility]  [address], [place] [address] [phone]    Make sure to wear two peace clothing  No lotions powders or deodorants the day of the appointment Make sure to bring picture ID and insurance card.  Bring list of medications you are currently taking including any supplements.       https://www.cdc.gov/vaccines/hcp/vis/vis-statements/tdap.pdf">  Tdap (Tetanus, Diphtheria, Pertussis) Vaccine: What You Need to Know 1. Why get  vaccinated? Tdap vaccine can prevent tetanus, diphtheria, and pertussis. Diphtheria and pertussis spread from person to person. Tetanus enters the body through cuts or wounds.  TETANUS (T) causes painful stiffening of the muscles. Tetanus can lead to serious health problems, including being unable to open the mouth, having trouble swallowing and breathing, or death.  DIPHTHERIA (D) can lead to difficulty breathing, heart failure, paralysis, or death.  PERTUSSIS (aP), also known as "whooping cough," can cause uncontrollable, violent coughing which makes it hard to breathe, eat, or drink. Pertussis can be extremely serious in babies and young children, causing pneumonia, convulsions, brain damage, or death. In teens and adults, it can cause weight loss, loss of bladder control, passing out, and rib fractures from severe coughing. 2. Tdap vaccine Tdap is only for children 7 years and older, adolescents, and adults.  Adolescents should receive a single dose of Tdap, preferably at age 28 or 52 years. Pregnant women should get a dose of Tdap during every pregnancy, to protect the newborn from pertussis. Infants are most at risk for severe, life-threatening complications from pertussis. Adults who have never received Tdap should get a dose of Tdap. Also, adults should receive a booster dose every 10 years, or earlier in the case of a severe and dirty wound or burn. Booster doses can be either Tdap or Td (a different vaccine that protects against tetanus and diphtheria but not pertussis). Tdap may be given at the same time as other vaccines. 3. Talk with your health care provider Tell your vaccine provider if the person getting the vaccine:  Has had an allergic reaction after a previous dose of any vaccine that protects against tetanus, diphtheria, or pertussis, or has any severe, life-threatening allergies.  Has had a coma, decreased level of consciousness, or prolonged seizures within 7 days after a  previous dose of any pertussis vaccine (DTP, DTaP, or Tdap).  Has seizures or another nervous system problem.  Has ever had Guillain-Barr Syndrome (also called GBS).  Has had severe pain or swelling after a previous dose of any vaccine that protects against tetanus or diphtheria. In some cases, your health care provider may decide to postpone Tdap vaccination to a future visit.  People with minor illnesses, such as a cold, may be vaccinated. People who are moderately or severely ill should usually wait until they recover before getting Tdap vaccine.  Your health care provider can give you more information. 4. Risks of a  vaccine reaction  Pain, redness, or swelling where the shot was given, mild fever, headache, feeling tired, and nausea, vomiting, diarrhea, or stomachache sometimes happen after Tdap vaccine. People sometimes faint after medical procedures, including vaccination. Tell your provider if you feel dizzy or have vision changes or ringing in the ears.  As with any medicine, there is a very remote chance of a vaccine causing a severe allergic reaction, other serious injury, or death. 5. What if there is a serious problem? An allergic reaction could occur after the vaccinated person leaves the clinic. If you see signs of a severe allergic reaction (hives, swelling of the face and throat, difficulty breathing, a fast heartbeat, dizziness, or weakness), call 9-1-1 and get the person to the nearest hospital. For other signs that concern you, call your health care provider.  Adverse reactions should be reported to the Vaccine Adverse Event Reporting System (VAERS). Your health care provider will usually file this report, or you can do it yourself. Visit the VAERS website at www.vaers.SamedayNews.es or call 5674459949. VAERS is only for reporting reactions, and VAERS staff do not give medical advice. 6. The National Vaccine Injury Compensation Program The [employer] Vaccine Injury Compensation  Program (VICP) is a federal program that was created to compensate people who may have been injured by certain vaccines. Visit the VICP website at GoldCloset.com.ee or call (424)199-8511 to learn about the program and about filing a claim. There is a time limit to file a claim for compensation. 7. How can I learn more?  Ask your health care provider.  Call your local or state health department.  Contact the Centers for Disease Control and Prevention (CDC): ? Call 567-281-5315 (1-800-CDC-INFO) or ? Visit CDC's website at http://hunter.com/ Vaccine Information Statement Tdap (Tetanus, Diphtheria, Pertussis) Vaccine (08/09/2018) This information is not intended to replace advice given to you by your health care provider. Make sure you discuss any questions you have with your health care provider. Document Revised: 08/18/2018 Document Reviewed: 08/21/2018 Elsevier Patient Education  [place].

## 2020-04-22 NOTE — Assessment & Plan Note (Signed)
Due for tetanus vaccine. Declines influenza vaccination. Mammogram overdue, orders placed. Colonoscopy due, referral placed to GI.  Discussed the importance of a healthy diet and regular exercise in order for weight loss, and to reduce the risk of any potential medical problems.  Exam today as noted. Labs pending.

## 2020-04-22 NOTE — Assessment & Plan Note (Signed)
No concerns today, continue to monitor. Discussed triggers for GERD.

## 2020-04-22 NOTE — Assessment & Plan Note (Signed)
Denies concerns for anxiety and depression.  Continue to monitor.  

## 2020-04-22 NOTE — Assessment & Plan Note (Signed)
Acute mostly localized to tendons of elbow and forearm. Suspect tendon irritation/inflammation, dicussed importance of rest, wear a brace while at work, ice.  Discussed to see sports medicine if no improvement.

## 2020-04-22 NOTE — Addendum Note (Signed)
Addended by: Donnamarie Poag on: 04/22/2020 02:35 PM   Modules accepted: Orders

## 2020-04-23 LAB — COMPREHENSIVE METABOLIC PANEL
ALT: 16 U/L (ref 0–35)
AST: 18 U/L (ref 0–37)
Albumin: 4.5 g/dL (ref 3.5–5.2)
Alkaline Phosphatase: 84 U/L (ref 39–117)
BUN: 12 mg/dL (ref 6–23)
CO2: 28 mEq/L (ref 19–32)
Calcium: 9.6 mg/dL (ref 8.4–10.5)
Chloride: 104 mEq/L (ref 96–112)
Creatinine, Ser: 0.97 mg/dL (ref 0.40–1.20)
GFR: 70.13 mL/min (ref >60.00)
Glucose, Bld: 90 mg/dL (ref 70–99)
Potassium: 4.5 mEq/L (ref 3.5–5.1)
Sodium: 139 mEq/L (ref 135–145)
Total Bilirubin: 0.3 mg/dL (ref 0.2–1.2)
Total Protein: 7.4 g/dL (ref 6.0–8.3)

## 2020-04-23 LAB — LIPID PANEL
Cholesterol: 166 mg/dL (ref 0–200)
HDL: 42.5 mg/dL (ref >39.00)
LDL Cholesterol: 89 mg/dL (ref 0–99)
NonHDL: 123.15
Total CHOL/HDL Ratio: 4
Triglycerides: 169 mg/dL — ABNORMAL HIGH (ref 0.0–149.0)
VLDL: 33.8 mg/dL (ref 0.0–40.0)

## 2020-04-23 LAB — CBC
HCT: 41.3 % (ref 36.0–46.0)
Hemoglobin: 14.2 g/dL (ref 12.0–15.0)
MCHC: 34.3 g/dL (ref 30.0–36.0)
MCV: 88.1 fl (ref 78.0–100.0)
Platelets: 324 10*3/uL (ref 150.0–400.0)
RBC: 4.69 Mil/uL (ref 3.87–5.11)
RDW: 13.3 % (ref 11.5–15.5)
WBC: 9.8 10*3/uL (ref 4.0–10.5)

## 2020-04-23 LAB — HEPATITIS C ANTIBODY
Hepatitis C Ab: NONREACTIVE
SIGNAL TO CUT-OFF: 0.01 (ref <1.00)

## 2020-04-23 LAB — HIV ANTIBODY (ROUTINE TESTING W REFLEX): HIV 1&2 Ab, 4th Generation: NONREACTIVE

## 2020-04-23 LAB — HEMOGLOBIN A1C: Hgb A1c MFr Bld: 6 % (ref 4.6–6.5)

## 2020-04-25 ENCOUNTER — Other Ambulatory Visit: Payer: Self-pay | Admitting: Primary Care

## 2020-04-25 DIAGNOSIS — J069 Acute upper respiratory infection, unspecified: Secondary | ICD-10-CM

## 2020-05-08 ENCOUNTER — Encounter: Payer: Self-pay | Admitting: *Deleted

## 2020-05-19 ENCOUNTER — Other Ambulatory Visit: Payer: Self-pay | Admitting: Family Medicine

## 2020-05-19 ENCOUNTER — Telehealth (INDEPENDENT_AMBULATORY_CARE_PROVIDER_SITE_OTHER): Payer: 59 | Admitting: Family Medicine

## 2020-05-19 ENCOUNTER — Telehealth: Payer: Self-pay

## 2020-05-19 ENCOUNTER — Encounter: Payer: Self-pay | Admitting: Family Medicine

## 2020-05-19 ENCOUNTER — Other Ambulatory Visit: Payer: 59

## 2020-05-19 VITALS — Ht 65.1 in

## 2020-05-19 DIAGNOSIS — R059 Cough, unspecified: Secondary | ICD-10-CM

## 2020-05-19 DIAGNOSIS — R0981 Nasal congestion: Secondary | ICD-10-CM | POA: Diagnosis not present

## 2020-05-19 DIAGNOSIS — Z20822 Contact with and (suspected) exposure to covid-19: Secondary | ICD-10-CM

## 2020-05-19 MED ORDER — PREDNISONE 20 MG PO TABS: ORAL_TABLET | ORAL | 0 refills | Status: DC

## 2020-05-19 MED ORDER — ALBUTEROL SULFATE HFA 108 (90 BASE) MCG/ACT IN AERS: 2.0000 | INHALATION_SPRAY | Freq: Four times a day (QID) | RESPIRATORY_TRACT | 0 refills | Status: DC | PRN

## 2020-05-19 NOTE — Progress Notes (Signed)
Patient scheduled for Covid testing this afternoon.  Back door instructions provided.  Work note sent to patient via MyChart as instructed by Dr. Patsy Lager.

## 2020-05-19 NOTE — Telephone Encounter (Signed)
[facility] Night - Client TELEPHONE ADVICE RECORD Access[employer] Patient Name: Erika Brock Gender: Female DOB: 13-Jun-1975 Age: 45 Y 5 M 18 D Return Phone Number: [phone] (Primary), [phone] (Secondary) Address: City/State/Zip: [name] [address] Client [facility] Night - Client Client Site [facility] - Night Physician Vernona Rieger - NP Contact Type Call Who Is Calling Patient / Member / Family / Caregiver Call Type Triage / Clinical Relationship To Patient Self Return Phone Number [phone] (Primary) Chief Complaint Headache Reason for Call Symptomatic / Request for Health Information Initial Comment Caller states she is sick and needs to speak to a nurse. Caller states she has body aches, sinus congestion and her head hurts. Translation No Nurse Assessment Nurse: Zenda Alpers, RN, Vickie Epley Date/Time (Eastern Time): 05/17/2020 8:11:34 AM Confirm and document reason for call. If symptomatic, describe symptoms. ---Caller states she has body aches, sinus congestion and her head hurts. Current temp 97.1(oral). Has cough. Started Wednesday night. No SOB. Does the patient have any new or worsening symptoms? ---Yes Will a triage be completed? ---Yes Related visit to physician within the last 2 weeks? ---No Does the PT have any chronic conditions? (i.e. diabetes, asthma, this includes High risk factors for pregnancy, etc.) ---Yes List chronic conditions. ---pre-diabetic Is the patient pregnant or possibly pregnant? (Ask all females between the ages of 26-55) ---No Is this a behavioral health or substance abuse call? ---No Guidelines Guideline Title Affirmed Question Affirmed Notes Nurse Date/Time (Eastern Time) COVID-19 - Diagnosed or Suspected [1] COVID-19 infection suspected by caller or triager AND [2] mild symptoms (cough, fever, or others) AND [3] has not gotten tested yet Zenda Alpers, RN, [facility]  05/17/2020 8:14:59 AM Disp. Time [name] Time) Disposition Final User 05/17/2020 8:26:21 AM Call PCP when Office is Open Yes Zenda Alpers, RN, Vickie Epley PLEASE NOTE: All timestamps contained within this report are represented as [place] Standard Time. CONFIDENTIALTY NOTICE: This fax transmission is intended only for the addressee. It contains information that is legally privileged, confidential or otherwise protected from use or disclosure. If you are not the intended recipient, you are strictly prohibited from reviewing, disclosing, copying using or disseminating any of this information or taking any action in reliance on or regarding this information. If you have received this fax in error, please notify us immediately by telephone so that we can arrange for its return to [place]. Phone: [phone], Toll-Free: [phone], Fax: [phone] Page: 2 of 2 Call Id: [id number] Caller Disagree/Comply Comply Caller Understands Yes PreDisposition Go to Urgent Care/Walk-In Clinic Care Advice Given Per Guideline CALL PCP WHEN OFFICE IS OPEN: * Call the office when it is open. * The treatment is the same whether you have COVID-19, influenza or some other respiratory virus. * Feeling dehydrated: Drink extra liquids. If the air in your home is dry, use a humidifier. * Fever: For fever over 101 F (38.3 C), take acetaminophen every 4 to 6 hours (Adults 650 mg) OR ibuprofen every 6 to 8 hours (Adults 400 mg). Before taking any medicine, read all the instructions on the package. Do not take aspirin unless your doctor has prescribed it for you. * Muscle aches, headache, and other pains: Often this comes and goes with the fever. Take acetaminophen every 4 to 6 hours (Adults 650 mg) OR ibuprofen every 6 to 8 hours (Adults 400 mg). Before taking any medicine, read all the instructions on the package. * Drink warm fluids. Inhale warm mist. This can help relax the airway  and also loosen up phlegm. * STAY HOME A MINIMUM OF 10  DAYS: Home isolation is needed for at least 10 days after the symptoms started. Stay home from school or work if you are sick. Do NOT go to religious services, child care centers, shopping, or other public places. Do NOT use public transportation (e.g., bus, taxis, ride-sharing). Do NOT allow any visitors to your home. Leave the house only if you need to seek urgent medical care. CALL BACK IF: * Fever over 103 F (39.4 C) * Fever lasts over 3 days * Fever returns after being gone for 24 hours * Chest pain or difficulty breathing occurs * You become worse CARE ADVICE given per COVID-19 - DIAGNOSED OR SUSPECTED (Adult) guideline. Comments User: Volney American, RN Date/Time [name] Time): 05/17/2020 8:15:50 AM states she is also fatigued and has not been vaccinated for COVID Referrals REFERRED TO PCP OFFICE

## 2020-05-19 NOTE — Telephone Encounter (Signed)
Noted, she is not vaccinated against Covid-19. 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

## 2020-05-19 NOTE — Progress Notes (Signed)
Erika Nutting T. Santiago Stenzel, MD Primary Care and Sports Medicine [facility] [address] [place], [address] Phone: [phone]  FAX: [phone]  Erika Brock - 45 y.o. female  MRN [id number]  Date of Birth: April 24, 1976  Visit Date: 05/19/2020  PCP: Doreene Nest, NP  Referred by: Doreene Nest, NP  Virtual Visit via Video Note:  I connected with  Erika Brock on 05/19/2020 11:40 AM EST by a video enabled telemedicine application and verified that I am speaking with the correct person using two identifiers.   Location patient: home computer, tablet, or smartphone Location provider: work or home office Consent: Verbal consent directly obtained from Erika Brock. Persons participating in the virtual visit: patient, provider  I discussed the limitations of evaluation and management by telemedicine and the availability of in person appointments. The patient expressed understanding and agreed to proceed.  Chief Complaint  Patient presents with  . Migraine  . Chills  . Nasal Congestion  . Cough  . Ear Pain  . Generalized Body Aches    History of Present Illness:  Started last Wed and she had a migraine ha.  Still had a ha through the night.  Could not get warm all day.  Did think that she had a fever.  Fatigue.  Has a cough.  Saturday, she did call and had body aches all over the place.   No energy.   Minimal ST.  Ate today, and she did have some nausea. No diarrhea.   Husband is sick.  He has lost his taste.  He has a covid test that is pending.  No test yet.  No smoking right now.  Normally she will smoke down to about 1/2 PPD.  Worked Friday night, too, when she had symptoms.  Immunization History  Administered Date(s) Administered  . Influenza,inj,Quad PF,6+ Mos 02/07/2014  . Tdap 04/22/2020  . Tetanus 05/10/2008    Alb + pred  Review of Systems as above: See pertinent positives and pertinent  negatives per HPI No acute distress verbally   Observations/Objective/Exam:  An attempt was made to discern vital signs over the phone and per patient if applicable and possible.   General:    Alert, Oriented, appears well and in no acute distress  Pulmonary:     On inspection no signs of respiratory distress.  Psych / Neurological:     Pleasant and cooperative.  Assessment and Plan:    ICD-10-CM   1. Suspected COVID-19 virus infection  Z20.822   2. Nasal congestion  R09.81   3. Cough  R05.9    Probable covid-19.  She is a smoker and unvaccinated.  She is AF now and does not have significant SOB.  With risk, I am going to send her in some steroids and albuterol.  Covid test today  I discussed the assessment and treatment plan with the patient. The patient was provided an opportunity to ask questions and all were answered. The patient agreed with the plan and demonstrated an understanding of the instructions.   The patient was advised to call back or seek an in-person evaluation if the symptoms worsen or if the condition fails to improve as anticipated.  Follow-up: prn unless noted otherwise below No follow-ups on file.  Meds ordered this encounter  Medications  . predniSONE (DELTASONE) 20 MG tablet    Sig: 2 tabs po daily for 5 days, then 1 tab po daily for 5  days    Dispense:  15 tablet    Refill:  0  . albuterol (VENTOLIN HFA) 108 (90 Base) MCG/ACT inhaler    Sig: Inhale 2 puffs into the lungs every 6 (six) hours as needed for wheezing or shortness of breath.    Dispense:  8 g    Refill:  0   No orders of the defined types were placed in this encounter.   Signed,  Elpidio Galea. Yuvan Medinger, MD

## 2020-05-19 NOTE — Progress Notes (Signed)
covid test

## 2020-05-20 LAB — SARS-COV-2, NAA 2 DAY TAT

## 2020-05-20 LAB — NOVEL CORONAVIRUS, NAA: SARS-CoV-2, NAA: DETECTED — AB

## 2020-05-20 LAB — SPECIMEN STATUS REPORT

## 2020-05-29 ENCOUNTER — Ambulatory Visit: Payer: 59 | Admitting: Internal Medicine

## 2020-05-29 ENCOUNTER — Encounter: Payer: Self-pay | Admitting: Internal Medicine

## 2020-05-29 ENCOUNTER — Other Ambulatory Visit: Payer: Self-pay

## 2020-05-29 VITALS — BP 122/84 | HR 81 | Temp 98.1°F | Wt 139.0 lb

## 2020-05-29 DIAGNOSIS — S61210A Laceration without foreign body of right index finger without damage to nail, initial encounter: Secondary | ICD-10-CM | POA: Diagnosis not present

## 2020-05-29 NOTE — Patient Instructions (Signed)
Laceration Care, Adult A laceration is a cut that may go through all layers of the skin. The cut may also go into the tissue that is right under the skin. Some cuts heal on their own. Others need to be closed with stitches (sutures), staples, skin adhesive strips, or skin glue. Taking care of your injury lowers your risk of infection, helps your injury to heal better, and may prevent scarring. Supplies needed:  Soap.  Water.  Hand sanitizer.  Bandage (dressing).  Antibiotic ointment.  Clean towel. How to take care of your cut Wash your hands with soap and water before touching your wound or changing your bandage. If soap and water are not available, use hand sanitizer. If your doctor used stitches or staples:  Keep the wound clean and dry.  If you were given a bandage, change it at least once a day as told by your doctor. You should also change it if it gets wet or dirty.  Keep the wound completely dry for the first 24 hours, or as told by your doctor. After that, you may take a shower or a bath. Do not get the wound soaked in water until after the stitches or staples have been removed.  Clean the wound once a day, or as told by your doctor: ? Wash the wound with soap and water. ? Rinse the wound with water to remove all soap. ? Pat the wound dry with a clean towel. Do not rub the wound.  After you clean the wound, put a thin layer of antibiotic ointment on it as told by your doctor. This ointment: ? Helps to prevent infection. ? Keeps the bandage from sticking to the wound.  Have your stitches or staples removed as told by your doctor. If your doctor used skin adhesive strips:  Keep the wound clean and dry.  If you were given a bandage, you should change it at least once a day as told by your doctor. You should also change it if it gets wet or dirty.  Do not get the skin adhesive strips wet. You can take a shower or a bath, but keep the wound dry.  If the wound gets wet,  pat it dry with a clean towel. Do not rub the wound.  Skin adhesive strips fall off on their own. You can trim the strips as the wound heals. Do not remove any strips that are still stuck to the wound. They will fall off after a while. If your doctor used skin glue:  Try to keep your wound dry, but you may briefly wet it in the shower or bath. Do not soak the wound in water, such as by swimming.  After you take a shower or a bath, gently pat the wound dry with a clean towel. Do not rub the wound.  Do not do any activities that will make you really sweaty until the skin glue has fallen off on its own.  Do not apply liquid, cream, or ointment medicine to your wound while the skin glue is still on.  If you were given a bandage, you should change it at least once a day or as told by your doctor. You should also change it if it gets dirty or wet.  If a bandage is placed over the wound, do not let the tape touch the skin glue.  Do not pick at the glue. The skin glue usually stays on for 5-10 days. Then, it falls off the skin. General   instructions  Take over-the-counter and prescription medicines only as told by your doctor.  If you were given antibiotic medicine or ointment, take or apply it as told by your doctor. Do not stop using it even if your condition improves.  Do not scratch or pick at the wound.  Check your wound every day for signs of infection. Watch for: ? Redness, swelling, or pain. ? Fluid, blood, or pus.  Raise (elevate) the injured area above the level of your heart while you are sitting or lying down.  If directed, put ice on the affected area: ? Put ice in a plastic bag. ? Place a towel between your skin and the bag. ? Leave the ice on for 20 minutes, 2-3 times a day.  Prevent scarring by covering your wound with sunscreen of at least 30 SPF whenever you are outside after your wound has healed.  Keep all follow-up visits as told by your doctor. This is important.    Get help if:  You got a tetanus shot and you have any of these problems at the injection site: ? Swelling. ? Very bad pain. ? Redness. ? Bleeding.  You have a fever.  A wound that was closed breaks open.  You notice a bad smell coming from your wound or your bandage.  You notice something coming out of the wound, such as wood or glass.  Medicine does not relieve your pain.  You have more redness, swelling, or pain at the site of your wound.  You have fluid, blood, or pus coming from your wound.  You notice a change in the color of your skin near your wound.  You need to change the bandage often because fluid, blood, or pus is coming from the wound.  You start to have a new rash.  You start to have numbness around the wound. Get help right away if:  You have very bad swelling around the wound.  Your pain suddenly gets worse and is very bad.  You notice painful lumps near the wound or anywhere on your body.  You have a red streak going away from your wound.  The wound is on your hand or foot, and: ? You cannot move a finger or toe. ? Your fingers or toes look pale or bluish. Summary  A laceration is a cut that may go through all layers of the skin. The cut may also go into the tissue right under the skin.  Some cuts heal on their own. Others need to be closed with stitches, staples, skin adhesive strips, or skin glue.  Follow your doctor's instructions for caring for your cut. Proper care of a cut lowers the risk of infection, helps the cut heal better, and prevents scarring. This information is not intended to replace advice given to you by your health care provider. Make sure you discuss any questions you have with your health care provider. Document Revised: 06/24/2017 Document Reviewed: 05/16/2017 Elsevier Patient Education  2021 [employer] Inc.  

## 2020-05-29 NOTE — Progress Notes (Signed)
Subjective:    Patient ID: Erika Brock, female    DOB: [birth year], 45 y.o.   MRN: [id number]  HPI  Pt presents to the clinic today with c/o laceration to her right index finger. She reports this occurred yesterday afternoon while using a slicer. She cleansed the wound with hydrogen peroxide but had difficulty controlling the bleeding. She reports the bleeding has stopped now. She denies redness, swelling or purulent discharge. She has used antibiotic ointment OTC. Her last tetanus was 04/2020  Review of Systems      Past Medical History:  Diagnosis Date  . Anemia    NOS  . Depression   . Headache(784.0)   . Migraine     Current Outpatient Medications  Medication Sig Dispense Refill  . albuterol (VENTOLIN HFA) 108 (90 Base) MCG/ACT inhaler Inhale 2 puffs into the lungs every 6 (six) hours as needed for wheezing or shortness of breath. 8 g 0  . cetirizine (ZYRTEC) 10 MG tablet Take 1 tablet (10 mg total) by mouth at bedtime. For allergies. 30 tablet 0  . fluticasone (FLONASE) 50 MCG/ACT nasal spray SHAKE LIQUID AND USE 1 SPRAY IN EACH NOSTRIL TWICE DAILY 16 g 0  . Ibuprofen (ADVIL MIGRAINE PO) Take 2 tablets by mouth daily as needed (headache).    . Multiple Vitamin (MULTIVITAMIN) tablet Take 1 tablet by mouth daily.    . predniSONE (DELTASONE) 20 MG tablet 2 tabs po daily for 5 days, then 1 tab po daily for 5 days 15 tablet 0   No current facility-administered medications for this visit.    Allergies  Allergen Reactions  . Prozac [Fluoxetine Hcl] Swelling  . Penicillins Hives, Swelling and Other (See Comments)    REACTION: Severe reaction.    Family History  Problem Relation Age of Onset  . Diabetes Mother   . Bipolar disorder Mother   . Diabetes Father   . Diabetes Daughter   . ADD / ADHD Son   . Diabetes Brother     Social History   Socioeconomic History  . Marital status: Married    Spouse name: Not on file  . Number of children: Not on file  . Years  of education: Not on file  . Highest education level: Not on file  Occupational History  . Not on file  Tobacco Use  . Smoking status: Current Every Day Smoker    Packs/day: 0.25    Years: 16.00    Pack years: 4.00    Types: Cigarettes  . Smokeless tobacco: Never Used  . Tobacco comment: smokes between 5-10 cigaretts a day  Substance and Sexual Activity  . Alcohol use: No  . Drug use: No  . Sexual activity: Not on file  Other Topics Concern  . Not on file  Social History Narrative   Lives with husband and 4 children in [place].  Moved from [place] 3 years ago.     Social Determinants of Health   Financial Resource Strain: Not on file  Food Insecurity: Not on file  Transportation Needs: Not on file  Physical Activity: Not on file  Stress: Not on file  Social Connections: Not on file  Intimate Partner Violence: Not on file     Constitutional: Denies fever, malaise, fatigue, headache or abrupt weight changes.  Respiratory: Denies difficulty breathing, shortness of breath, cough or sputum production.   Cardiovascular: Denies chest pain, chest tightness, palpitations or swelling in the hands or feet.  Musculoskeletal: Denies decrease in range of  motion, difficulty with gait, muscle pain or joint pain and swelling.  Skin: Pt reports laceration of right index finger. Denies redness, rashes, lesions or ulcercations.   No other specific complaints in a complete review of systems (except as listed in HPI above).  Objective:   Physical Exam  BP 122/84   Pulse 81   Temp 98.1 F (36.7 C) (Temporal)   Wt 139 lb (63 kg)   BMI 23.06 kg/m  Wt Readings from Last 3 Encounters:  05/29/20 139 lb (63 kg)  04/22/20 137 lb (62.1 kg)  03/28/20 134 lb (60.8 kg)    General: Appears her stated age, well developed, well nourished in NAD. Skin: 1 cm crescent shaped laceration to right lateral index finger. Cardiovascular: Radial pulse 2+ on the right.  Musculoskeletal: Decreased  flexion of the right index finger- she is concerned it will open up and bleed again. Normal extension. No joint swelling noted. Neurological: Alert and oriented. Sensation intact to right index finger.   BMET    Component Value Date/Time   NA 139 04/22/2020 1438   K 4.5 04/22/2020 1438   CL 104 04/22/2020 1438   CO2 28 04/22/2020 1438   GLUCOSE 90 04/22/2020 1438   BUN 12 04/22/2020 1438   CREATININE 0.97 04/22/2020 1438   CALCIUM 9.6 04/22/2020 1438   GFRNONAA >60 10/07/2017 0131   GFRAA >60 10/07/2017 0131    Lipid Panel     Component Value Date/Time   CHOL 166 04/22/2020 1438   TRIG 169.0 (H) 04/22/2020 1438   HDL 42.50 04/22/2020 1438   CHOLHDL 4 04/22/2020 1438   VLDL 33.8 04/22/2020 1438   LDLCALC 89 04/22/2020 1438    CBC    Component Value Date/Time   WBC 9.8 04/22/2020 1438   RBC 4.69 04/22/2020 1438   HGB 14.2 04/22/2020 1438   HCT 41.3 04/22/2020 1438   PLT 324.0 04/22/2020 1438   MCV 88.1 04/22/2020 1438   MCH 31.1 10/07/2017 0131   MCHC 34.3 04/22/2020 1438   RDW 13.3 04/22/2020 1438   LYMPHSABS 0.9 (L) 12/05/2014 0942   MONOABS 0.3 12/05/2014 0942   EOSABS 0.0 12/05/2014 0942   BASOSABS 0.0 12/05/2014 0942    Hgb A1C Lab Results  Component Value Date   HGBA1C 6.0 04/22/2020            Assessment & Plan:   Laceration of Right Index Finger:  Wound is already closed and approximated Wound cleansed with hydrogen peroxide Benzoin and steri strips applied Covered with triple antibiotic ointment, bandaid and splint for protection After care instructions given  Return precautions discussed Nicki Reaper, NP This visit occurred during the SARS-CoV-2 public health emergency.  Safety protocols were in place, including screening questions prior to the visit, additional usage of staff PPE, and extensive cleaning of exam room while observing appropriate contact time as indicated for disinfecting solutions.

## 2020-06-10 ENCOUNTER — Other Ambulatory Visit: Payer: Self-pay | Admitting: Family Medicine

## 2020-06-11 NOTE — Telephone Encounter (Signed)
Called and spoke to patient refill not requested. No longer having any symptoms.

## 2020-11-05 ENCOUNTER — Inpatient Hospital Stay: Admission: RE | Admit: 2020-11-05 | Payer: 59 | Source: Ambulatory Visit

## 2020-12-29 ENCOUNTER — Other Ambulatory Visit: Payer: Self-pay

## 2020-12-29 ENCOUNTER — Ambulatory Visit
Admission: RE | Admit: 2020-12-29 | Discharge: 2020-12-29 | Disposition: A | Discharge status: Home / Self Care | Payer: 59 | Source: Ambulatory Visit | Attending: Primary Care | Admitting: Primary Care

## 2020-12-29 DIAGNOSIS — Z1231 Encounter for screening mammogram for malignant neoplasm of breast: Secondary | ICD-10-CM

## 2020-12-31 ENCOUNTER — Other Ambulatory Visit: Payer: Self-pay | Admitting: Primary Care

## 2020-12-31 DIAGNOSIS — R928 Other abnormal and inconclusive findings on diagnostic imaging of breast: Secondary | ICD-10-CM

## 2021-01-16 ENCOUNTER — Other Ambulatory Visit: Payer: 59

## 2021-01-26 ENCOUNTER — Ambulatory Visit
Admission: RE | Admit: 2021-01-26 | Discharge: 2021-01-26 | Disposition: A | Discharge status: Home / Self Care | Payer: 59 | Source: Ambulatory Visit | Attending: Primary Care | Admitting: Primary Care

## 2021-01-26 ENCOUNTER — Other Ambulatory Visit: Payer: Self-pay

## 2021-01-26 DIAGNOSIS — R928 Other abnormal and inconclusive findings on diagnostic imaging of breast: Secondary | ICD-10-CM

## 2021-02-05 ENCOUNTER — Emergency Department
Admission: EM | Admit: 2021-02-05 | Discharge: 2021-02-05 | Disposition: A | Discharge status: Home / Self Care | Payer: No Typology Code available for payment source | Attending: Emergency Medicine | Admitting: Emergency Medicine

## 2021-02-05 ENCOUNTER — Other Ambulatory Visit: Payer: Self-pay

## 2021-02-05 ENCOUNTER — Emergency Department: Payer: No Typology Code available for payment source

## 2021-02-05 ENCOUNTER — Encounter: Payer: Self-pay | Admitting: Emergency Medicine

## 2021-02-05 DIAGNOSIS — W010XXA Fall on same level from slipping, tripping and stumbling without subsequent striking against object, initial encounter: Secondary | ICD-10-CM | POA: Diagnosis not present

## 2021-02-05 DIAGNOSIS — S8991XA Unspecified injury of right lower leg, initial encounter: Secondary | ICD-10-CM | POA: Diagnosis present

## 2021-02-05 DIAGNOSIS — S8001XA Contusion of right knee, initial encounter: Secondary | ICD-10-CM | POA: Diagnosis not present

## 2021-02-05 DIAGNOSIS — Y990 Civilian activity done for income or pay: Secondary | ICD-10-CM | POA: Diagnosis not present

## 2021-02-05 DIAGNOSIS — F17210 Nicotine dependence, cigarettes, uncomplicated: Secondary | ICD-10-CM | POA: Insufficient documentation

## 2021-02-05 MED ORDER — HYDROCODONE-ACETAMINOPHEN 5-325 MG PO TABS: 2.0000 | ORAL_TABLET | Freq: Four times a day (QID) | ORAL | 0 refills | Status: DC | PRN

## 2021-02-05 MED ORDER — HYDROCODONE-ACETAMINOPHEN 5-325 MG PO TABS
2.0000 | ORAL_TABLET | Freq: Once | ORAL | Status: AC
Start: 2021-02-05 — End: 2021-02-05
  Administered 2021-02-05: 2 via ORAL
  Filled 2021-02-05: qty 2

## 2021-02-05 MED ORDER — IBUPROFEN 800 MG PO TABS
800.0000 mg | ORAL_TABLET | Freq: Once | ORAL | Status: AC
  Administered 2021-02-05: 800 mg via ORAL
  Filled 2021-02-05: qty 1

## 2021-02-05 MED ORDER — ONDANSETRON 4 MG PO TBDP
4.0000 mg | ORAL_TABLET | Freq: Once | ORAL | Status: AC
  Administered 2021-02-05: 4 mg via ORAL
  Filled 2021-02-05: qty 1

## 2021-02-05 MED ORDER — IBUPROFEN 800 MG PO TABS: 800.0000 mg | ORAL_TABLET | Freq: Three times a day (TID) | ORAL | 0 refills | Status: DC | PRN

## 2021-02-05 MED ORDER — ONDANSETRON 4 MG PO TBDP: 4.0000 mg | ORAL_TABLET | Freq: Four times a day (QID) | ORAL | 0 refills | Status: DC | PRN

## 2021-02-05 NOTE — Discharge Instructions (Signed)

## 2021-02-05 NOTE — ED Provider Notes (Signed)
[facility] Emergency Department Provider Note ____________________________________________   Event Date/Time   First MD Initiated Contact with Patient 02/05/21 [phone]     (approximate)  I have reviewed the triage vital signs and the nursing notes.   HISTORY  Chief Complaint Knee Injury    HPI Erika Brock is a 45 y.o. female with history of migraines, anemia who presents to the emergency department after she tripped and fell at work today over a curb and landed on her right knee.  States she is having a lot of pain in this knee and is unable to ambulate.  Did not hit her head or lose consciousness.  Denies neck or back pain, chest or abdominal pain.  No numbness, tingling or weakness.  She denies any symptoms that led to her fall.         Past Medical History:  Diagnosis Date   Anemia    NOS   Depression    Headache(784.0)    Migraine     Patient Active Problem List   Diagnosis Date Noted   Upper extremity pain 04/22/2020   Gastroesophageal reflux disease 05/26/2017   PTSD (post-traumatic stress disorder) 01/24/2017   Abnormal ultrasound of pelvis 09/24/2013   Preventative health care 09/13/2013   Abdominal pain, left lower quadrant 09/13/2013   POOR CONCENTRATION 02/17/2009   Depression with anxiety 02/17/2009   ANEMIA, HX OF 02/17/2009    Past Surgical History:  Procedure Laterality Date   VAGINAL HYSTERECTOMY  2002   Prolapse, unsure if she still has ovaries    Prior to Admission medications   Medication Sig Start Date End Date Taking? Authorizing Provider  HYDROcodone-acetaminophen (NORCO/VICODIN) 5-325 MG tablet Take 2 tablets by mouth every 6 (six) hours as needed. 02/05/21  Yes Milos Milligan, Layla Maw, DO  ibuprofen (ADVIL) 800 MG tablet Take 1 tablet (800 mg total) by mouth every 8 (eight) hours as needed for mild pain. 02/05/21  Yes Jovane Foutz, Baxter Hire N, DO  ondansetron (ZOFRAN ODT) 4 MG disintegrating tablet Take 1 tablet (4 mg total)  by mouth every 6 (six) hours as needed for nausea or vomiting. 02/05/21  Yes Bostyn Kunkler, Baxter Hire N, DO  albuterol (VENTOLIN HFA) 108 (90 Base) MCG/ACT inhaler Inhale 2 puffs into the lungs every 6 (six) hours as needed for wheezing or shortness of breath. 05/19/20   Copland, Karleen Hampshire, MD  cetirizine (ZYRTEC) 10 MG tablet Take 1 tablet (10 mg total) by mouth at bedtime. For allergies. 03/28/20   Doreene Nest, NP  fluticasone (FLONASE) 50 MCG/ACT nasal spray SHAKE LIQUID AND USE 1 SPRAY IN EACH NOSTRIL TWICE DAILY 04/25/20   Doreene Nest, NP  Multiple Vitamin (MULTIVITAMIN) tablet Take 1 tablet by mouth daily.    [provider]    Allergies Prozac [fluoxetine hcl] and Penicillins  Family History  Problem Relation Age of Onset   Diabetes Mother    Bipolar disorder Mother    Diabetes Father    Diabetes Daughter    ADD / ADHD Son    Diabetes Brother     Social History Social History   Tobacco Use   Smoking status: Every Day    Packs/day: 0.25    Years: 16.00    Pack years: 4.00    Types: Cigarettes   Smokeless tobacco: Never   Tobacco comments:    smokes between 5-10 cigaretts a day  Vaping Use   Vaping Use: Never used  Substance Use Topics   Alcohol use: No  Drug use: No    Review of Systems Constitutional: No fever. Eyes: No visual changes. ENT: No sore throat. Cardiovascular: Denies chest pain. Respiratory: Denies shortness of breath. Gastrointestinal: No nausea, vomiting, diarrhea. Genitourinary: Negative for dysuria. Musculoskeletal: Negative for back pain. Skin: Negative for rash. Neurological: Negative for focal weakness or numbness.   ____________________________________________   PHYSICAL EXAM:  VITAL SIGNS: ED Triage Vitals  Enc Vitals Group     BP 02/05/21 0147 (!) 144/83     Pulse Rate 02/05/21 0147 98     Resp 02/05/21 0147 18     Temp 02/05/21 0147 97.9 F (36.6 C)     Temp Source 02/05/21 0147 Oral     SpO2 02/05/21 0147 94 %      Weight 02/05/21 0146 138 lb (62.6 kg)     Height 02/05/21 0146 5\' 1"  (1.549 m)     Head Circumference --      Peak Flow --      Pain Score 02/05/21 0146 6     Pain Loc --      Pain Edu? --      Excl. in GC? --    CONSTITUTIONAL: Alert and oriented and responds appropriately to questions. Well-appearing; well-nourished; GCS 15 HEAD: Normocephalic; atraumatic EYES: Conjunctivae clear, PERRL, EOMI ENT: normal nose; no rhinorrhea; moist mucous membranes; pharynx without lesions noted; no dental injury; no septal hematoma NECK: Supple, no meningismus, no LAD; no midline spinal tenderness, step-off or deformity; trachea midline CARD: RRR; S1 and S2 appreciated; no murmurs, no clicks, no rubs, no gallops RESP: Normal chest excursion without splinting or tachypnea; breath sounds clear and equal bilaterally; no wheezes, no rhonchi, no rales; no hypoxia or respiratory distress CHEST:  chest wall stable, no crepitus or ecchymosis or deformity, nontender to palpation; no flail chest ABD/GI: Normal bowel sounds; non-distended; soft, non-tender, no rebound, no guarding; no ecchymosis or other lesions noted PELVIS:  stable, nontender to palpation BACK:  The back appears normal and is non-tender to palpation, there is no CVA tenderness; no midline spinal tenderness, step-off or deformity EXT: Tender to palpation diffusely over the anterior right knee with soft tissue swelling and ecchymosis.  Difficult to test ligamentous laxity due to patient's intolerance.  She has no tenderness over the right hip, ankle or foot.  2+ right DP pulse on exam.  Compartments soft.  Otherwise extremities nontender to palpation. SKIN: Normal color for age and race; warm NEURO: Moves all extremities equally, normal sensation diffusely, normal speech PSYCH: The patient's mood and manner are appropriate. Grooming and personal hygiene are appropriate.  ____________________________________________   LABS (all labs ordered  are listed, but only abnormal results are displayed)  Labs Reviewed - No data to display ____________________________________________  EKG   ____________________________________________  RADIOLOGY I, Shalunda Lindh, personally viewed and evaluated these images (plain radiographs) as part of my medical decision making, as well as reviewing the written report by the radiologist.  ED MD interpretation: X-ray shows no acute abnormality.  Official radiology report(s): DG Knee Complete 4 Views Right  Result Date: 02/05/2021 CLINICAL DATA:  Right knee injury EXAM: RIGHT KNEE - COMPLETE 4+ VIEW COMPARISON:  None. FINDINGS: No evidence of fracture, dislocation, or joint effusion. No evidence of arthropathy or other focal bone abnormality. Soft tissues are unremarkable. IMPRESSION: Negative. Electronically Signed   By: 02/07/2021 M.D.   On: 02/05/2021 02:44    ____________________________________________   PROCEDURES  Procedure(s) performed (including Critical Care):  Procedures   ____________________________________________  INITIAL IMPRESSION / ASSESSMENT AND PLAN / ED COURSE  As part of my medical decision making, I reviewed the following data within the electronic MEDICAL RECORD NUMBER Nursing notes reviewed and incorporated, Old chart reviewed, Radiograph reviewed , Notes from prior ED visits, and [place] Controlled Substance Database         Patient here after mechanical fall at work.  Injured the right knee.  Neurovascular intact distally.  X-ray shows no fracture, dislocation.  Did not hit her head or lose consciousness.  Will place in knee sleeve and provide instructions for ice, rest, elevation.  Given crutches to use as needed.  Will discharge with pain medication and provided with work note for the next several days.  Discussed with patient that I feel she can return to work full duty on Monday but if she is still having significant symptoms and unable to bear weight at this time  she should follow-up with occupational health at her job, her PCP or orthopedics.  Discussed return precautions.  Patient verbalized understanding and is comfortable with this plan.  At this time, I do not feel there is any life-threatening condition present. I have reviewed, interpreted and discussed all results (EKG, imaging, lab, urine as appropriate) and exam findings with patient/family. I have reviewed nursing notes and appropriate previous records.  I feel the patient is safe to be discharged home without further emergent workup and can continue workup as an outpatient as needed. Discussed usual and customary return precautions. Patient/family verbalize understanding and are comfortable with this plan.  Outpatient follow-up has been provided as needed. All questions have been answered.   ____________________________________________   FINAL CLINICAL IMPRESSION(S) / ED DIAGNOSES  Final diagnoses:  Right knee injury, initial encounter     ED Discharge Orders          Ordered    HYDROcodone-acetaminophen (NORCO/VICODIN) 5-325 MG tablet  Every 6 hours PRN        02/05/21 0346    ibuprofen (ADVIL) 800 MG tablet  Every 8 hours PRN        02/05/21 0346    ondansetron (ZOFRAN ODT) 4 MG disintegrating tablet  Every 6 hours PRN        02/05/21 0346            *Please note:  Erika Brock was evaluated in Emergency Department on 02/05/2021 for the symptoms described in the history of present illness. She was evaluated in the context of the global COVID-19 pandemic, which necessitated consideration that the patient might be at risk for infection with the SARS-CoV-2 virus that causes COVID-19. Institutional protocols and algorithms that pertain to the evaluation of patients at risk for COVID-19 are in a state of rapid change based on information released by regulatory bodies including the CDC and federal and state organizations. These policies and algorithms were followed during the  patient's care in the ED.  Some ED evaluations and interventions may be delayed as a result of limited staffing during and the pandemic.*   Note:  This document was prepared using Dragon voice recognition software and may include unintentional dictation errors.    Jamaia Brum, Layla Maw, DO 02/05/21 [phone]

## 2021-02-05 NOTE — ED Triage Notes (Signed)
Pt to triage via w/c with no distress noted; pt reports injuring rt knee after tripping off curb tonight at work; pt employed with [employer] (workers comp profile indicates no drug testing required)

## 2021-04-09 ENCOUNTER — Telehealth (INDEPENDENT_AMBULATORY_CARE_PROVIDER_SITE_OTHER): Payer: 59 | Admitting: Family Medicine

## 2021-04-09 ENCOUNTER — Other Ambulatory Visit: Payer: Self-pay

## 2021-04-09 VITALS — Ht 61.0 in

## 2021-04-09 DIAGNOSIS — J111 Influenza due to unidentified influenza virus with other respiratory manifestations: Secondary | ICD-10-CM | POA: Diagnosis not present

## 2021-04-09 DIAGNOSIS — R6889 Other general symptoms and signs: Secondary | ICD-10-CM

## 2021-04-09 NOTE — Progress Notes (Signed)
Interactive audio and video telecommunications were attempted between this provider and patient, however failed, due to patient having technical difficulties OR patient did not have access to video capability.  We continued and completed visit with audio only.   Virtual Visit via Telephone Note  I connected with patient on 04/09/21  at 2:37 PM  by telephone and verified that I am speaking with the correct person using two identifiers.  Location of patient: home   Location of MD: [facility] Name of referring provider (if blank then none associated): Names per persons and role in encounter:  MD: Ferd Hibbs, Patient: name listed above.    I discussed the limitations, risks, security and privacy concerns of performing an evaluation and management service by telephone and the availability of in person appointments. I also discussed with the patient that there may be a patient responsible charge related to this service. The patient expressed understanding and agreed to proceed.  CC: cough.    History of Present Illness:  sx started about 2 weeks ago.  Seen at [facility], put on zmax for ear infection per patient report.  Had R sided ear pain at that point.  The pain got better but R ear hearing is still muffled but not absent.  More cough and sinus pressure and congestion.  HA.  Took theraflu, that helped a little.  Now with cough, to the point of vomiting occasionally.  No recent SABA use.  Temp 100.4 today.  Diffuse aches noted since last night.  No recent covid or flu test.  Diarrhea recently noted.      Observations/Objective:nad Speech normal except for slightly hoarse voice.  Does not sound short of breath.  Assessment and Plan:  Likely viral syndrome.  Nontoxic.  Still okay for outpatient follow-up.  Discussed options.  We will arrange for outpatient flu/COVID swab and address results.  Reasonable to use albuterol as needed.  Supportive care otherwise.  She agrees with plan.  Addendum.   Flu positive.  Prescription sent for Tamiflu.  Routine cautions given to patient.  Follow Up Instructions: see above.    I discussed the assessment and treatment plan with the patient. The patient was provided an opportunity to ask questions and all were answered. The patient agreed with the plan and demonstrated an understanding of the instructions.   The patient was advised to call back or seek an in-person evaluation if the symptoms worsen or if the condition fails to improve as anticipated.  I provided 14 minutes of non-face-to-face time during this encounter.  Crawford Givens, MD

## 2021-04-10 ENCOUNTER — Encounter: Payer: Self-pay | Admitting: Family Medicine

## 2021-04-10 ENCOUNTER — Other Ambulatory Visit: Payer: Self-pay | Admitting: Family Medicine

## 2021-04-10 LAB — COVID-19, FLU A+B AND RSV
Influenza A, NAA: DETECTED — AB
Influenza B, NAA: NOT DETECTED
RSV, NAA: NOT DETECTED
SARS-CoV-2, NAA: NOT DETECTED

## 2021-04-10 MED ORDER — OSELTAMIVIR PHOSPHATE 75 MG PO CAPS: 75.0000 mg | ORAL_CAPSULE | Freq: Two times a day (BID) | ORAL | 0 refills | Status: DC

## 2021-04-12 DIAGNOSIS — J111 Influenza due to unidentified influenza virus with other respiratory manifestations: Secondary | ICD-10-CM | POA: Insufficient documentation

## 2021-04-12 NOTE — Assessment & Plan Note (Signed)
Likely viral syndrome.  Nontoxic.  Still okay for outpatient follow-up.  Discussed options.  We will arrange for outpatient flu/COVID swab and address results.  Reasonable to use albuterol as needed.  Supportive care otherwise.  She agrees with plan.  Addendum.  Flu positive.  Prescription sent for Tamiflu.  Routine cautions given to patient.

## 2021-12-08 ENCOUNTER — Telehealth: Payer: Self-pay | Admitting: Primary Care

## 2021-12-08 NOTE — Telephone Encounter (Signed)
Patient notified as instructed by telephone and verbalized understanding. Patient stated that she started with symptoms day before yesterday. Patient stated that she has no way of doing a covid test and has no one that can bring her one. Patient stated that she has a cough and has vomited  Patient was advised to make sure that she wears a mask when she comes to the office.

## 2021-12-08 NOTE — Telephone Encounter (Signed)
Per appt notes pt already has in office appt scheduled for 12/09/21 at 10:40. Sending note to Allayne Gitelman NP and [facility] CMA.

## 2021-12-08 NOTE — Telephone Encounter (Signed)
[facility] Day - Client TELEPHONE ADVICE RECORD AccessNurse Patient Name: PATRICIA FARGO Gender: Female DOB: 06/16/1975 Age: 46 Y 11 D Return Phone Number: [phone] (Primary) Address: City/ State/ Zip: [name] [address] Client [facility] Day - Client Client Site [facility] - Day Provider Vernona Rieger - NP Contact Type Call Who Is Calling Patient / Member / Family / Caregiver Call Type Triage / Clinical Relationship To Patient Self Return Phone Number [phone] (Primary) Chief Complaint Cough Reason for Call Symptomatic / Request for Health Information Initial Comment Caller states, patient has a cough, shaky on the inside. Has not tested for COVID. Has an appt. tomorrow with Dr. Chestine Spore. Stuffy nose. Translation No Nurse Assessment Nurse: Evonnie Dawes, RN, Cala Bradford Date/Time (Eastern Time): 12/08/2021 10:31:27 AM Confirm and document reason for call. If symptomatic, describe symptoms. ---Caller states, patient has a productive cough, shaky on the inside. Has not tested for COVID. Has an appt. tomorrow with Dr. Chestine Spore. Stuffy nose. Symptoms started yesterday. Temp is 98.4. Does the patient have any new or worsening symptoms? ---Yes Will a triage be completed? ---Yes Related visit to physician within the last 2 weeks? ---No Does the PT have any chronic conditions? (i.e. diabetes, asthma, this includes High risk factors for pregnancy, etc.) ---No Is the patient pregnant or possibly pregnant? (Ask all females between the ages of 39-55) ---No Is this a behavioral health or substance abuse call? ---No Guidelines Guideline Title Affirmed Question Affirmed Notes Nurse Date/Time (Eastern Time) Cough - Acute Productive SEVERE coughing spells (e.g., whooping sound after coughing, vomiting after coughing) Daves, RN, Cala Bradford 12/08/2021 10:33:05 AM PLEASE NOTE: All timestamps contained within this  report are represented as [place] Standard Time. CONFIDENTIALTY NOTICE: This fax transmission is intended only for the addressee. It contains information that is legally privileged, confidential or otherwise protected from use or disclosure. If you are not the intended recipient, you are strictly prohibited from reviewing, disclosing, copying using or disseminating any of this information or taking any action in reliance on or regarding this information. If you have received this fax in error, please notify us immediately by telephone so that we can arrange for its return to [place]. Phone: [phone], Toll-Free: [phone], Fax: [phone] Page: 2 of 2 Call Id: [id number] Disp. Time [name] Time) Disposition Final User 12/08/2021 10:37:56 AM See PCP within 24 Hours Yes Evonnie Dawes, RN, Cala Bradford Final Disposition 12/08/2021 10:37:56 AM See PCP within 24 Hours Yes Evonnie Dawes, RN, Anders Simmonds Disagree/Comply Comply Caller Understands Yes PreDisposition Call Doctor Care Advice Given Per Guideline SEE PCP WITHIN 24 HOURS: * IF OFFICE WILL BE OPEN: You need to be examined within the next 24 hours. Call your doctor (or NP/PA) when the office opens and make an appointment. COUGHING SPELLS: * Drink warm fluids. Inhale warm mist. This can help relax the airway and also loosen up phlegm. * Suck on cough drops or hard candy to coat the irritated throat. * HOME REMEDY - HONEY: This old home remedy has been shown to help decrease coughing at night. The adult dosage is 2 teaspoons (10 ml) at bedtime. HUMIDIFIER: * If the air is dry, use a humidifier in the bedroom. * Dry air makes coughs worse. * Eat smaller amounts with meal and snack to reduce the chances of repeated vomiting. * Reason: vomiting is more likely with a full stomach. VOMITING WITH COUGHING SPELLS: CALL BACK IF: * Difficulty breathing occurs * You become worse CARE ADVICE given  per Cough - Acute Productive (Adult) guideline. Comments User: Jenene Slicker, RN Date/Time [name] Time): 12/08/2021 10:38:27 AM Pt already has appt for tomorrow morning. Referrals REFERRED TO PCP OFFIC

## 2021-12-08 NOTE — Telephone Encounter (Signed)
Please have patient test for Covid-19 prior to her visit if possible. If she doesn't have access to a Covid test then we will test her upon arrival.

## 2021-12-08 NOTE — Telephone Encounter (Signed)
Noted. Joellen, FYI. 

## 2021-12-08 NOTE — Telephone Encounter (Signed)
Patient called and stated that she is coughing, stuffy nose, no covid test, Has an appointment scheduled for 12/09/2021 at 10:40. Patient was sent to access nurse.

## 2021-12-09 ENCOUNTER — Encounter: Payer: Self-pay | Admitting: Primary Care

## 2021-12-09 ENCOUNTER — Ambulatory Visit (INDEPENDENT_AMBULATORY_CARE_PROVIDER_SITE_OTHER): Payer: Self-pay | Admitting: Primary Care

## 2021-12-09 VITALS — BP 118/74 | HR 66 | Temp 98.6°F | Ht 61.0 in | Wt 143.0 lb

## 2021-12-09 DIAGNOSIS — J069 Acute upper respiratory infection, unspecified: Secondary | ICD-10-CM

## 2021-12-09 DIAGNOSIS — R051 Acute cough: Secondary | ICD-10-CM

## 2021-12-09 LAB — POC COVID19 BINAXNOW: SARS Coronavirus 2 Ag: NEGATIVE

## 2021-12-09 MED ORDER — CETIRIZINE HCL 10 MG PO TABS: 10.0000 mg | ORAL_TABLET | Freq: Every day | ORAL | 0 refills | Status: AC

## 2021-12-09 MED ORDER — BENZONATATE 200 MG PO CAPS: 200.0000 mg | ORAL_CAPSULE | Freq: Three times a day (TID) | ORAL | 0 refills | Status: DC | PRN

## 2021-12-09 NOTE — Progress Notes (Signed)
Subjective:    Patient ID: Erika Brock, female    DOB: 02/28/1976, 46 y.o.   MRN: [id number]  Cough Associated symptoms include chills, headaches and postnasal drip. Pertinent negatives include no chest pain, fever, sore throat or shortness of breath.    Erika Brock is a very pleasant 46 y.o. female with a history of influenza, GERD who presents today to discuss cough.  Symptom onset two days ago with intermittent vomiting, about 5 episodes, she thought she ate something bad but doesn't recall eating anything abnormal. She then began to notice cough, feeling shaky, nasal congestion, fevers, post nasal drip, and headaches.   She denies fevers, sore throat, a history of asthma, diarrhea, abdominal pain, diarrhea. Her last episode of vomiting was 2 days ago. She has not tested for Covid-19 infection.  She's taken Tylenol with improvement in headache.    Review of Systems  Constitutional:  Positive for chills and fatigue. Negative for fever.  HENT:  Positive for congestion and postnasal drip. Negative for sore throat.   Respiratory:  Positive for cough. Negative for shortness of breath.   Cardiovascular:  Negative for chest pain.  Gastrointestinal:  Negative for abdominal pain, constipation, diarrhea, nausea and vomiting.  Neurological:  Positive for headaches.         Past Medical History:  Diagnosis Date   Anemia    NOS   Depression    Headache(784.0)    Migraine     Social History   Socioeconomic History   Marital status: Married    Spouse name: Not on file   Number of children: Not on file   Years of education: Not on file   Highest education level: Not on file  Occupational History   Not on file  Tobacco Use   Smoking status: Every Day    Packs/day: 0.25    Years: 16.00    Total pack years: 4.00    Types: Cigarettes   Smokeless tobacco: Never   Tobacco comments:    smokes between 5-10 cigaretts a day  Vaping Use   Vaping Use: Never used   Substance and Sexual Activity   Alcohol use: No   Drug use: No   Sexual activity: Not on file  Other Topics Concern   Not on file  Social History Narrative   Lives with husband and 4 children in [place].  Moved from [place] 3 years ago.     Social Determinants of Health   Financial Resource Strain: Not on file  Food Insecurity: Not on file  Transportation Needs: Not on file  Physical Activity: Not on file  Stress: Not on file  Social Connections: Not on file  Intimate Partner Violence: Not on file    Past Surgical History:  Procedure Laterality Date   VAGINAL HYSTERECTOMY  2002   Prolapse, unsure if she still has ovaries    Family History  Problem Relation Age of Onset   Diabetes Mother    Bipolar disorder Mother    Diabetes Father    Diabetes Daughter    ADD / ADHD Son    Diabetes Brother     Allergies  Allergen Reactions   Prozac [Fluoxetine Hcl] Swelling   Penicillins Hives, Swelling and Other (See Comments)    REACTION: Severe reaction.    Current Outpatient Medications on File Prior to Visit  Medication Sig Dispense Refill   albuterol (VENTOLIN HFA) 108 (90 Base) MCG/ACT inhaler Inhale 2 puffs into the lungs every 6 (six) hours  as needed for wheezing or shortness of breath. 8 g 0   Multiple Vitamin (MULTIVITAMIN) tablet Take 1 tablet by mouth daily.     No current facility-administered medications on file prior to visit.    BP 118/74   Pulse 66   Temp 98.6 F (37 C) (Oral)   Ht 5\' 1"  (1.549 m)   Wt 143 lb (64.9 kg)   SpO2 97%   BMI 27.02 kg/m  Objective:   Physical Exam Constitutional:      Appearance: She is ill-appearing.  HENT:     Right Ear: Tympanic membrane and ear canal normal.     Left Ear: Tympanic membrane and ear canal normal.     Nose:     Right Sinus: No maxillary sinus tenderness or frontal sinus tenderness.     Left Sinus: No maxillary sinus tenderness or frontal sinus tenderness.     Mouth/Throat:     Pharynx: No  posterior oropharyngeal erythema.  Eyes:     Conjunctiva/sclera: Conjunctivae normal.  Cardiovascular:     Rate and Rhythm: Normal rate and regular rhythm.  Pulmonary:     Effort: Pulmonary effort is normal.     Breath sounds: Normal breath sounds. No wheezing or rales.     Comments: deep, dry cough noted during exam Musculoskeletal:     Cervical back: Neck supple.  Lymphadenopathy:     Cervical: No cervical adenopathy.  Skin:    General: Skin is warm and dry.           Assessment & Plan:   Problem List Items Addressed This Visit       Respiratory   Viral URI with cough - Primary    Symptoms representative of viral etiology. She is Covid-19 negative today.  Discussed conservative treatment. Rx for Tessalon Perles 200 mg TID provided. Rx for Zyrtec 10 mg provided.  Fluids, rest. Work note provided to remain out the rest of the week. Return precautions provided.       Relevant Medications   benzonatate (TESSALON) 200 MG capsule   cetirizine (ZYRTEC) 10 MG tablet   Other Visit Diagnoses     Acute cough       Relevant Orders   POC COVID-19          , NP

## 2021-12-09 NOTE — Assessment & Plan Note (Signed)
Symptoms representative of viral etiology. She is Covid-19 negative today.  Discussed conservative treatment. Rx for Tessalon Perles 200 mg TID provided. Rx for Zyrtec 10 mg provided.  Fluids, rest. Work note provided to remain out the rest of the week. Return precautions provided.

## 2021-12-09 NOTE — Patient Instructions (Signed)
You may take Benzonatate capsules for cough. Take 1 capsule by mouth three times daily as needed for cough.  Resume Zyrtec 10 mg at bedtime for drainage.   Drink plenty of water and rest.  It was a pleasure to see you today!  Upper Respiratory Infection, Adult An upper respiratory infection (URI) affects the nose, throat, and upper airways that lead to the lungs. The most common type of URI is often called the common cold. URIs usually get better on their own, without medical treatment. What are the causes? A URI is caused by a germ (virus). You may catch these germs by: Breathing in droplets from an infected person's cough or sneeze. Touching something that has the germ on it (is contaminated) and then touching your mouth, nose, or eyes. What increases the risk? You are more likely to get a URI if: You are very young or very old. You have close contact with others, such as at work, school, or a health care facility. You smoke. You have long-term (chronic) heart or lung disease. You have a weakened disease-fighting system (immune system). You have nasal allergies or asthma. You have a lot of stress. You have poor nutrition. What are the signs or symptoms? Runny or stuffy (congested) nose. Cough. Sneezing. Sore throat. Headache. Feeling tired (fatigue). Fever. Not wanting to eat as much as usual. Pain in your forehead, behind your eyes, and over your cheekbones (sinus pain). Muscle aches. Redness or irritation of the eyes. Pressure in the ears or face. How is this treated? URIs usually get better on their own within 7-10 days. Medicines cannot cure URIs, but your doctor may recommend certain medicines to help relieve symptoms, such as: Over-the-counter cold medicines. Medicines to reduce coughing (cough suppressants). Coughing is a type of defense against infection that helps to clear the nose, throat, windpipe, and lungs (respiratory system). Take these medicines only as told  by your doctor. Medicines to lower your fever. Follow these instructions at home: Activity Rest as needed. If you have a fever, stay home from work or school until your fever is gone, or until your doctor says you may return to work or school. You should stay home until you cannot spread the infection anymore (you are not contagious). Your doctor may have you wear a face mask so you have less risk of spreading the infection. Relieving symptoms Rinse your mouth often with salt water. To make salt water, dissolve -1 tsp (3-6 g) of salt in 1 cup (237 mL) of warm water. Use a cool-mist humidifier to add moisture to the air. This can help you breathe more easily. Eating and drinking  Drink enough fluid to keep your pee (urine) pale yellow. Eat soups and other clear broths. General instructions  Take over-the-counter and prescription medicines only as told by your doctor. Do not smoke or use any products that contain nicotine or tobacco. If you need help quitting, ask your doctor. Avoid being where people are smoking (avoid secondhand smoke). Stay up to date on all your shots (immunizations), and get the flu shot every year. Keep all follow-up visits. How to prevent the spread of infection to others  Wash your hands with soap and water for at least 20 seconds. If you cannot use soap and water, use hand sanitizer. Avoid touching your mouth, face, eyes, or nose. Cough or sneeze into a tissue or your sleeve or elbow. Do not cough or sneeze into your hand or into the air. Contact a doctor if:  You are getting worse, not better. You have any of these: A fever or chills. Brown or red mucus in your nose. Yellow or brown fluid (discharge)coming from your nose. Pain in your face, especially when you bend forward. Swollen neck glands. Pain when you swallow. White areas in the back of your throat. Get help right away if: You have shortness of breath that gets worse. You have very bad or  constant: Headache. Ear pain. Pain in your forehead, behind your eyes, and over your cheekbones (sinus pain). Chest pain. You have long-lasting (chronic) lung disease along with any of these: Making high-pitched whistling sounds when you breathe, most often when you breathe out (wheezing). Long-lasting cough (more than 14 days). Coughing up blood. A change in your usual mucus. You have a stiff neck. You have changes in your: Vision. Hearing. Thinking. Mood. These symptoms may be an emergency. Get help right away. Call 911. Do not wait to see if the symptoms will go away. Do not drive yourself to the hospital. Summary An upper respiratory infection (URI) is caused by a germ (virus). The most common type of URI is often called the common cold. URIs usually get better within 7-10 days. Take over-the-counter and prescription medicines only as told by your doctor. This information is not intended to replace advice given to you by your health care provider. Make sure you discuss any questions you have with your health care provider. Document Revised: 11/26/2020 Document Reviewed: 11/26/2020 Elsevier Patient Education  [place].

## 2023-01-04 ENCOUNTER — Encounter: Payer: Self-pay | Admitting: Family Medicine

## 2023-01-04 ENCOUNTER — Ambulatory Visit: Payer: 59 | Admitting: Family Medicine

## 2023-01-04 VITALS — BP 122/80 | HR 105 | Temp 98.3°F | Ht 61.0 in | Wt 143.0 lb

## 2023-01-04 DIAGNOSIS — J069 Acute upper respiratory infection, unspecified: Secondary | ICD-10-CM | POA: Diagnosis not present

## 2023-01-04 DIAGNOSIS — R059 Cough, unspecified: Secondary | ICD-10-CM | POA: Insufficient documentation

## 2023-01-04 DIAGNOSIS — R051 Acute cough: Secondary | ICD-10-CM | POA: Diagnosis not present

## 2023-01-04 LAB — POC COVID19 BINAXNOW: SARS Coronavirus 2 Ag: NEGATIVE

## 2023-01-04 MED ORDER — ALBUTEROL SULFATE HFA 108 (90 BASE) MCG/ACT IN AERS: 2.0000 | INHALATION_SPRAY | Freq: Four times a day (QID) | RESPIRATORY_TRACT | 1 refills | Status: DC | PRN

## 2023-01-04 MED ORDER — DOXYCYCLINE HYCLATE 100 MG PO TABS: 100.0000 mg | ORAL_TABLET | Freq: Two times a day (BID) | ORAL | 0 refills | Status: DC

## 2023-01-04 MED ORDER — BENZONATATE 200 MG PO CAPS: 200.0000 mg | ORAL_CAPSULE | Freq: Three times a day (TID) | ORAL | 0 refills | Status: DC | PRN

## 2023-01-04 NOTE — Progress Notes (Signed)
duration of symptoms: about 4 days.  Started with fever yesterday.   rhinorrhea: occ congestion: yes sore throat: yes cough: yes myalgias: no R ear stuffy.  L vs R frontal pain.  Taking pseudofed.  Needs refill on SABA.    Per HPI unless specifically indicated in ROS section   Meds, vitals, and allergies reviewed.   GEN: nad, alert and oriented HEENT: mucous membranes moist, TM w/o erythema,  OP with cobblestoning NECK: supple w/o LA CV: rrr. PULM: ctab, no inc wob, dry cough noted.  ABD: soft, +bs EXT: no edema Sinuses not ttp at time of OV.   Covid test done at OV.  Neg.

## 2023-01-04 NOTE — Patient Instructions (Addendum)
Rest and fluids.  Out of work for now.  Take care.  Glad to see you. Tessalon and albuterol as needed for cough.  Start doxycycline and update [place] as needed.

## 2023-01-04 NOTE — Assessment & Plan Note (Signed)
Covid neg, Rest and fluids.  Out of work for now.  Tessalon and albuterol as needed for cough.  Start doxycycline and update [place] as needed.

## 2023-03-08 ENCOUNTER — Ambulatory Visit: Payer: 59 | Admitting: Family Medicine

## 2023-03-08 ENCOUNTER — Encounter: Payer: Self-pay | Admitting: Family Medicine

## 2023-03-08 VITALS — BP 122/74 | HR 100 | Temp 98.1°F | Ht 61.0 in | Wt 142.2 lb

## 2023-03-08 DIAGNOSIS — J069 Acute upper respiratory infection, unspecified: Secondary | ICD-10-CM

## 2023-03-08 DIAGNOSIS — R059 Cough, unspecified: Secondary | ICD-10-CM

## 2023-03-08 LAB — POC COVID19 BINAXNOW: SARS Coronavirus 2 Ag: NEGATIVE

## 2023-03-08 LAB — POCT INFLUENZA A/B
Influenza A, POC: NEGATIVE
Influenza B, POC: NEGATIVE

## 2023-03-08 MED ORDER — BENZONATATE 200 MG PO CAPS: 200.0000 mg | ORAL_CAPSULE | Freq: Three times a day (TID) | ORAL | 1 refills | Status: AC | PRN

## 2023-03-08 MED ORDER — ALBUTEROL SULFATE HFA 108 (90 BASE) MCG/ACT IN AERS: 2.0000 | INHALATION_SPRAY | Freq: Four times a day (QID) | RESPIRATORY_TRACT | 1 refills | Status: DC | PRN

## 2023-03-08 NOTE — Progress Notes (Unsigned)
duration of symptoms: 2 days.   rhinorrhea: yes congestion: yes ear pain: no sore throat: yes cough: yes, sputum myalgias: yes No fevers.   SABA helps with cough.  Tessalon helps a little.   No vomiting, no diarrhea.  Dec in appetite.  Sleep disrupted.    Discussed routine vaccination.  She is working to cut back on smoking.   Per HPI unless specifically indicated in ROS section  Meds, vitals, and allergies reviewed.   GEN: nad, alert and oriented HEENT: mucous membranes moist, TM w/o erythema, nasal epithelium injected, OP with cobblestoning NECK: supple w/o LA CV: rrr. PULM: ctab, no inc wob ABD: soft, +bs EXT: no edema  Covid and flu neg at OV.

## 2023-03-08 NOTE — Patient Instructions (Signed)
Out of work in the meantime.  Tessalon and albuterol if needed.  Take care.  Glad to see you. Rest and fluids.

## 2023-03-09 NOTE — Assessment & Plan Note (Signed)
Presumed nonspecific viral infection.  Still okay for outpatient follow-up. Out of work in the meantime.  Tessalon and albuterol if needed.  Rest and fluids.

## 2023-05-01 ENCOUNTER — Other Ambulatory Visit: Payer: Self-pay | Admitting: Family Medicine
